# Patient Record
Sex: Male | Born: 1960
Health system: Southern US, Community
[De-identification: ages and names within clinical notes are randomized; demographics above are authoritative.]

## PROBLEM LIST (undated history)

## (undated) DIAGNOSIS — E119 Type 2 diabetes mellitus without complications: Secondary | ICD-10-CM

## (undated) DIAGNOSIS — E876 Hypokalemia: Secondary | ICD-10-CM

## (undated) DIAGNOSIS — I10 Essential (primary) hypertension: Secondary | ICD-10-CM

## (undated) HISTORY — DX: Type 2 diabetes mellitus without complications: E11.9

## (undated) HISTORY — DX: Essential (primary) hypertension: I10

## (undated) HISTORY — DX: Hypokalemia: E87.6

---

## 2006-10-10 ENCOUNTER — Encounter: Admission: RE | Admit: 2006-10-10 | Discharge: 2006-10-10 | Payer: Self-pay | Admitting: Gastroenterology

## 2008-03-05 ENCOUNTER — Encounter: Admission: RE | Admit: 2008-03-05 | Discharge: 2008-05-04 | Payer: Self-pay | Admitting: Family Medicine

## 2008-11-02 ENCOUNTER — Ambulatory Visit (HOSPITAL_COMMUNITY): Admission: RE | Admit: 2008-11-02 | Discharge: 2008-11-02 | Payer: Self-pay | Admitting: Gastroenterology

## 2008-11-02 ENCOUNTER — Encounter (INDEPENDENT_AMBULATORY_CARE_PROVIDER_SITE_OTHER): Payer: Self-pay | Admitting: Gastroenterology

## 2011-09-01 LAB — CBC
HCT: 43.4 % (ref 39.0–52.0)
RBC: 4.93 MIL/uL (ref 4.22–5.81)
WBC: 7 10*3/uL (ref 4.0–10.5)

## 2011-09-01 LAB — GLUCOSE, CAPILLARY: Glucose-Capillary: 256 mg/dL — ABNORMAL HIGH (ref 70–99)

## 2011-09-01 LAB — PROTIME-INR: INR: 1.1 (ref 0.00–1.49)

## 2016-03-16 DIAGNOSIS — M9901 Segmental and somatic dysfunction of cervical region: Secondary | ICD-10-CM | POA: Diagnosis not present

## 2016-03-16 DIAGNOSIS — M5032 Other cervical disc degeneration, mid-cervical region, unspecified level: Secondary | ICD-10-CM | POA: Diagnosis not present

## 2016-03-16 DIAGNOSIS — M531 Cervicobrachial syndrome: Secondary | ICD-10-CM | POA: Diagnosis not present

## 2016-03-16 DIAGNOSIS — M5033 Other cervical disc degeneration, cervicothoracic region: Secondary | ICD-10-CM | POA: Diagnosis not present

## 2016-04-04 DIAGNOSIS — Z111 Encounter for screening for respiratory tuberculosis: Secondary | ICD-10-CM | POA: Diagnosis not present

## 2016-04-13 DIAGNOSIS — M5032 Other cervical disc degeneration, mid-cervical region, unspecified level: Secondary | ICD-10-CM | POA: Diagnosis not present

## 2016-04-13 DIAGNOSIS — M9901 Segmental and somatic dysfunction of cervical region: Secondary | ICD-10-CM | POA: Diagnosis not present

## 2016-04-13 DIAGNOSIS — M531 Cervicobrachial syndrome: Secondary | ICD-10-CM | POA: Diagnosis not present

## 2016-04-13 DIAGNOSIS — M5033 Other cervical disc degeneration, cervicothoracic region: Secondary | ICD-10-CM | POA: Diagnosis not present

## 2016-05-10 DIAGNOSIS — M5032 Other cervical disc degeneration, mid-cervical region, unspecified level: Secondary | ICD-10-CM | POA: Diagnosis not present

## 2016-05-10 DIAGNOSIS — M5033 Other cervical disc degeneration, cervicothoracic region: Secondary | ICD-10-CM | POA: Diagnosis not present

## 2016-05-10 DIAGNOSIS — M531 Cervicobrachial syndrome: Secondary | ICD-10-CM | POA: Diagnosis not present

## 2016-05-10 DIAGNOSIS — M9901 Segmental and somatic dysfunction of cervical region: Secondary | ICD-10-CM | POA: Diagnosis not present

## 2016-06-07 DIAGNOSIS — M531 Cervicobrachial syndrome: Secondary | ICD-10-CM | POA: Diagnosis not present

## 2016-06-07 DIAGNOSIS — M5032 Other cervical disc degeneration, mid-cervical region, unspecified level: Secondary | ICD-10-CM | POA: Diagnosis not present

## 2016-06-07 DIAGNOSIS — M5033 Other cervical disc degeneration, cervicothoracic region: Secondary | ICD-10-CM | POA: Diagnosis not present

## 2016-06-07 DIAGNOSIS — M9901 Segmental and somatic dysfunction of cervical region: Secondary | ICD-10-CM | POA: Diagnosis not present

## 2016-06-19 DIAGNOSIS — L821 Other seborrheic keratosis: Secondary | ICD-10-CM | POA: Diagnosis not present

## 2016-06-19 DIAGNOSIS — D1801 Hemangioma of skin and subcutaneous tissue: Secondary | ICD-10-CM | POA: Diagnosis not present

## 2016-06-19 DIAGNOSIS — L820 Inflamed seborrheic keratosis: Secondary | ICD-10-CM | POA: Diagnosis not present

## 2016-07-12 DIAGNOSIS — M9901 Segmental and somatic dysfunction of cervical region: Secondary | ICD-10-CM | POA: Diagnosis not present

## 2016-07-12 DIAGNOSIS — M531 Cervicobrachial syndrome: Secondary | ICD-10-CM | POA: Diagnosis not present

## 2016-07-12 DIAGNOSIS — M5032 Other cervical disc degeneration, mid-cervical region, unspecified level: Secondary | ICD-10-CM | POA: Diagnosis not present

## 2016-07-12 DIAGNOSIS — M5033 Other cervical disc degeneration, cervicothoracic region: Secondary | ICD-10-CM | POA: Diagnosis not present

## 2016-08-09 DIAGNOSIS — M5032 Other cervical disc degeneration, mid-cervical region, unspecified level: Secondary | ICD-10-CM | POA: Diagnosis not present

## 2016-08-09 DIAGNOSIS — M5033 Other cervical disc degeneration, cervicothoracic region: Secondary | ICD-10-CM | POA: Diagnosis not present

## 2016-08-09 DIAGNOSIS — M9901 Segmental and somatic dysfunction of cervical region: Secondary | ICD-10-CM | POA: Diagnosis not present

## 2016-08-09 DIAGNOSIS — M531 Cervicobrachial syndrome: Secondary | ICD-10-CM | POA: Diagnosis not present

## 2016-08-28 DIAGNOSIS — Z23 Encounter for immunization: Secondary | ICD-10-CM | POA: Diagnosis not present

## 2016-09-06 DIAGNOSIS — M5032 Other cervical disc degeneration, mid-cervical region, unspecified level: Secondary | ICD-10-CM | POA: Diagnosis not present

## 2016-09-06 DIAGNOSIS — M5033 Other cervical disc degeneration, cervicothoracic region: Secondary | ICD-10-CM | POA: Diagnosis not present

## 2016-09-06 DIAGNOSIS — M531 Cervicobrachial syndrome: Secondary | ICD-10-CM | POA: Diagnosis not present

## 2016-09-06 DIAGNOSIS — M9901 Segmental and somatic dysfunction of cervical region: Secondary | ICD-10-CM | POA: Diagnosis not present

## 2016-10-11 DIAGNOSIS — M9901 Segmental and somatic dysfunction of cervical region: Secondary | ICD-10-CM | POA: Diagnosis not present

## 2016-10-11 DIAGNOSIS — M5033 Other cervical disc degeneration, cervicothoracic region: Secondary | ICD-10-CM | POA: Diagnosis not present

## 2016-10-11 DIAGNOSIS — M5032 Other cervical disc degeneration, mid-cervical region, unspecified level: Secondary | ICD-10-CM | POA: Diagnosis not present

## 2016-10-11 DIAGNOSIS — M531 Cervicobrachial syndrome: Secondary | ICD-10-CM | POA: Diagnosis not present

## 2016-11-08 DIAGNOSIS — M5032 Other cervical disc degeneration, mid-cervical region, unspecified level: Secondary | ICD-10-CM | POA: Diagnosis not present

## 2016-11-08 DIAGNOSIS — M531 Cervicobrachial syndrome: Secondary | ICD-10-CM | POA: Diagnosis not present

## 2016-11-08 DIAGNOSIS — M9901 Segmental and somatic dysfunction of cervical region: Secondary | ICD-10-CM | POA: Diagnosis not present

## 2016-11-08 DIAGNOSIS — M5033 Other cervical disc degeneration, cervicothoracic region: Secondary | ICD-10-CM | POA: Diagnosis not present

## 2016-11-27 HISTORY — PX: COLONOSCOPY: SHX174

## 2016-12-12 DIAGNOSIS — M5032 Other cervical disc degeneration, mid-cervical region, unspecified level: Secondary | ICD-10-CM | POA: Diagnosis not present

## 2016-12-12 DIAGNOSIS — M9901 Segmental and somatic dysfunction of cervical region: Secondary | ICD-10-CM | POA: Diagnosis not present

## 2016-12-12 DIAGNOSIS — M5033 Other cervical disc degeneration, cervicothoracic region: Secondary | ICD-10-CM | POA: Diagnosis not present

## 2016-12-12 DIAGNOSIS — M531 Cervicobrachial syndrome: Secondary | ICD-10-CM | POA: Diagnosis not present

## 2017-01-16 DIAGNOSIS — M5033 Other cervical disc degeneration, cervicothoracic region: Secondary | ICD-10-CM | POA: Diagnosis not present

## 2017-01-16 DIAGNOSIS — M531 Cervicobrachial syndrome: Secondary | ICD-10-CM | POA: Diagnosis not present

## 2017-01-16 DIAGNOSIS — M5032 Other cervical disc degeneration, mid-cervical region, unspecified level: Secondary | ICD-10-CM | POA: Diagnosis not present

## 2017-01-16 DIAGNOSIS — M9901 Segmental and somatic dysfunction of cervical region: Secondary | ICD-10-CM | POA: Diagnosis not present

## 2017-02-06 DIAGNOSIS — M5033 Other cervical disc degeneration, cervicothoracic region: Secondary | ICD-10-CM | POA: Diagnosis not present

## 2017-02-06 DIAGNOSIS — M531 Cervicobrachial syndrome: Secondary | ICD-10-CM | POA: Diagnosis not present

## 2017-02-06 DIAGNOSIS — M5032 Other cervical disc degeneration, mid-cervical region, unspecified level: Secondary | ICD-10-CM | POA: Diagnosis not present

## 2017-02-06 DIAGNOSIS — M9901 Segmental and somatic dysfunction of cervical region: Secondary | ICD-10-CM | POA: Diagnosis not present

## 2017-02-15 DIAGNOSIS — Z125 Encounter for screening for malignant neoplasm of prostate: Secondary | ICD-10-CM | POA: Diagnosis not present

## 2017-02-15 DIAGNOSIS — I10 Essential (primary) hypertension: Secondary | ICD-10-CM | POA: Diagnosis not present

## 2017-02-15 DIAGNOSIS — R809 Proteinuria, unspecified: Secondary | ICD-10-CM | POA: Diagnosis not present

## 2017-02-15 DIAGNOSIS — K760 Fatty (change of) liver, not elsewhere classified: Secondary | ICD-10-CM | POA: Diagnosis not present

## 2017-02-15 DIAGNOSIS — E1329 Other specified diabetes mellitus with other diabetic kidney complication: Secondary | ICD-10-CM | POA: Diagnosis not present

## 2017-02-15 DIAGNOSIS — E782 Mixed hyperlipidemia: Secondary | ICD-10-CM | POA: Diagnosis not present

## 2017-02-19 DIAGNOSIS — R809 Proteinuria, unspecified: Secondary | ICD-10-CM | POA: Diagnosis not present

## 2017-02-23 DIAGNOSIS — E042 Nontoxic multinodular goiter: Secondary | ICD-10-CM | POA: Diagnosis not present

## 2017-03-06 DIAGNOSIS — M9901 Segmental and somatic dysfunction of cervical region: Secondary | ICD-10-CM | POA: Diagnosis not present

## 2017-03-06 DIAGNOSIS — M5033 Other cervical disc degeneration, cervicothoracic region: Secondary | ICD-10-CM | POA: Diagnosis not present

## 2017-03-06 DIAGNOSIS — M531 Cervicobrachial syndrome: Secondary | ICD-10-CM | POA: Diagnosis not present

## 2017-03-06 DIAGNOSIS — M5032 Other cervical disc degeneration, mid-cervical region, unspecified level: Secondary | ICD-10-CM | POA: Diagnosis not present

## 2017-03-26 ENCOUNTER — Encounter: Payer: Self-pay | Admitting: Endocrinology

## 2017-03-26 ENCOUNTER — Ambulatory Visit (INDEPENDENT_AMBULATORY_CARE_PROVIDER_SITE_OTHER): Payer: BLUE CROSS/BLUE SHIELD | Admitting: Endocrinology

## 2017-03-26 DIAGNOSIS — I10 Essential (primary) hypertension: Secondary | ICD-10-CM

## 2017-03-26 DIAGNOSIS — E042 Nontoxic multinodular goiter: Secondary | ICD-10-CM | POA: Diagnosis not present

## 2017-03-26 DIAGNOSIS — E876 Hypokalemia: Secondary | ICD-10-CM | POA: Diagnosis not present

## 2017-03-26 DIAGNOSIS — E119 Type 2 diabetes mellitus without complications: Secondary | ICD-10-CM

## 2017-03-26 NOTE — Progress Notes (Signed)
   Subjective:    Patient ID: Francis Silva, male    DOB: [birth year], 56 y.o.   MRN: [id number]  HPI Pt is referred by Dr Rex Kras, for nodular thyroid.  In 2015, pt was incidentally noted to have a slightly nodular thyroid, on carotid US.  He is unaware of ever having had thyroid problems in the past.  He has no h/o XRT or surgery to the neck.  He denies neck pain, or assoc swelling. Past Medical History:  Diagnosis Date  . Diabetes ([place])   . HTN (hypertension)   . Hypokalemia     No past surgical history on file.  Social History   Social History  . Marital status: Married    Spouse name: N/A  . Number of children: N/A  . Years of education: N/A   Occupational History  . Not on file.   Social History Main Topics  . Smoking status: Never Smoker  . Smokeless tobacco: Never Used  . Alcohol use Not on file  . Drug use: Unknown  . Sexual activity: Not on file   Other Topics Concern  . Not on file   Social History Narrative  . No narrative on file    No current outpatient prescriptions on file prior to visit.   No current facility-administered medications on file prior to visit.     Not on File  Family History  Problem Relation Age of Onset  . Thyroid disease Sister     BP (!) 146/82   Pulse 98   Ht 5\' 9"  (1.753 m)   Wt 210 lb (95.3 kg)   SpO2 96%   BMI 31.01 kg/m     Review of Systems Denies weight change, hoarseness, diplopia, chest pain, sob, cough, dysphagia, diarrhea, itching, flushing, easy bruising, depression, cold intolerance, headache, numbness, and rhinorrhea.      Objective:   Physical Exam VS: see vs page GEN: no distress HEAD: head: no deformity eyes: no periorbital swelling, no proptosis external nose and ears are normal mouth: no lesion seen NECK: thyroid is not enlarged.  The surface is slightly irreg, but I cannot palpate any discrete nodule.   CHEST WALL: no deformity LUNGS: clear to auscultation CV: reg rate and rhythm, no  murmur ABD: abdomen is soft, nontender.  no hepatosplenomegaly.  not distended.  no hernia MUSCULOSKELETAL: muscle bulk and strength are grossly normal.  no obvious joint swelling.  gait is normal and steady.  EXTEMITIES: no deformity.  no edema PULSES: no carotid bruit NEURO:  cn 2-12 grossly intact.   readily moves all 4's.  sensation is intact to touch on all 4's.  No tremor. SKIN:  Normal texture and temperature.  No rash or suspicious lesion is visible.  Not diaphoretic.  NODES:  None palpable at the neck PSYCH: alert, well-oriented.  Does not appear anxious nor depressed.    outside test results are reviewed:  TSH=1  Thyroid US; multinodular goiter, with no nodule meeting criteria for bx.  I have reviewed outside records, and summarized: Pt was noted to have goiter, and referred here. Other med probs were stable, and pt was feeling well.     Assessment & Plan:  Multinodular goiter, new.    Patient Instructions  No thyroid treatment or further testing is needed now. Please return in 1 year. most of the time, a "lumpy thyroid" will eventually become overactive.  this is usually a slow process, happening over the span of many years.

## 2017-03-26 NOTE — Patient Instructions (Signed)
No thyroid treatment or further testing is needed now. Please return in 1 year. most of the time, a "lumpy thyroid" will eventually become overactive.  this is usually a slow process, happening over the span of many years.

## 2017-03-27 ENCOUNTER — Encounter: Payer: Self-pay | Admitting: Endocrinology

## 2017-03-27 DIAGNOSIS — I10 Essential (primary) hypertension: Secondary | ICD-10-CM | POA: Insufficient documentation

## 2017-03-27 DIAGNOSIS — E042 Nontoxic multinodular goiter: Secondary | ICD-10-CM | POA: Insufficient documentation

## 2017-03-27 DIAGNOSIS — E876 Hypokalemia: Secondary | ICD-10-CM | POA: Insufficient documentation

## 2017-03-27 DIAGNOSIS — E119 Type 2 diabetes mellitus without complications: Secondary | ICD-10-CM | POA: Insufficient documentation

## 2017-04-03 DIAGNOSIS — M5032 Other cervical disc degeneration, mid-cervical region, unspecified level: Secondary | ICD-10-CM | POA: Diagnosis not present

## 2017-04-03 DIAGNOSIS — M5033 Other cervical disc degeneration, cervicothoracic region: Secondary | ICD-10-CM | POA: Diagnosis not present

## 2017-04-03 DIAGNOSIS — M531 Cervicobrachial syndrome: Secondary | ICD-10-CM | POA: Diagnosis not present

## 2017-04-03 DIAGNOSIS — M9901 Segmental and somatic dysfunction of cervical region: Secondary | ICD-10-CM | POA: Diagnosis not present

## 2017-04-12 DIAGNOSIS — Z1211 Encounter for screening for malignant neoplasm of colon: Secondary | ICD-10-CM | POA: Diagnosis not present

## 2017-04-19 DIAGNOSIS — E1129 Type 2 diabetes mellitus with other diabetic kidney complication: Secondary | ICD-10-CM | POA: Diagnosis not present

## 2017-04-19 DIAGNOSIS — Z6831 Body mass index (BMI) 31.0-31.9, adult: Secondary | ICD-10-CM | POA: Diagnosis not present

## 2017-04-19 DIAGNOSIS — R809 Proteinuria, unspecified: Secondary | ICD-10-CM | POA: Diagnosis not present

## 2017-04-19 DIAGNOSIS — I10 Essential (primary) hypertension: Secondary | ICD-10-CM | POA: Diagnosis not present

## 2017-04-20 ENCOUNTER — Other Ambulatory Visit: Payer: Self-pay | Admitting: Nephrology

## 2017-04-20 DIAGNOSIS — R809 Proteinuria, unspecified: Secondary | ICD-10-CM

## 2017-04-20 DIAGNOSIS — E119 Type 2 diabetes mellitus without complications: Secondary | ICD-10-CM | POA: Diagnosis not present

## 2017-04-30 ENCOUNTER — Ambulatory Visit
Admission: RE | Admit: 2017-04-30 | Discharge: 2017-04-30 | Disposition: A | Discharge status: Home / Self Care | Payer: BLUE CROSS/BLUE SHIELD | Source: Ambulatory Visit | Attending: Nephrology | Admitting: Nephrology

## 2017-04-30 DIAGNOSIS — R809 Proteinuria, unspecified: Secondary | ICD-10-CM

## 2017-05-01 DIAGNOSIS — M5033 Other cervical disc degeneration, cervicothoracic region: Secondary | ICD-10-CM | POA: Diagnosis not present

## 2017-05-01 DIAGNOSIS — M531 Cervicobrachial syndrome: Secondary | ICD-10-CM | POA: Diagnosis not present

## 2017-05-01 DIAGNOSIS — M5032 Other cervical disc degeneration, mid-cervical region, unspecified level: Secondary | ICD-10-CM | POA: Diagnosis not present

## 2017-05-01 DIAGNOSIS — M9901 Segmental and somatic dysfunction of cervical region: Secondary | ICD-10-CM | POA: Diagnosis not present

## 2017-05-29 DIAGNOSIS — I10 Essential (primary) hypertension: Secondary | ICD-10-CM | POA: Diagnosis not present

## 2017-05-29 DIAGNOSIS — D472 Monoclonal gammopathy: Secondary | ICD-10-CM | POA: Diagnosis not present

## 2017-05-29 DIAGNOSIS — R809 Proteinuria, unspecified: Secondary | ICD-10-CM | POA: Diagnosis not present

## 2017-05-29 DIAGNOSIS — E1129 Type 2 diabetes mellitus with other diabetic kidney complication: Secondary | ICD-10-CM | POA: Diagnosis not present

## 2017-05-31 DIAGNOSIS — M5033 Other cervical disc degeneration, cervicothoracic region: Secondary | ICD-10-CM | POA: Diagnosis not present

## 2017-05-31 DIAGNOSIS — M5032 Other cervical disc degeneration, mid-cervical region, unspecified level: Secondary | ICD-10-CM | POA: Diagnosis not present

## 2017-05-31 DIAGNOSIS — M9901 Segmental and somatic dysfunction of cervical region: Secondary | ICD-10-CM | POA: Diagnosis not present

## 2017-05-31 DIAGNOSIS — M531 Cervicobrachial syndrome: Secondary | ICD-10-CM | POA: Diagnosis not present

## 2017-06-08 ENCOUNTER — Other Ambulatory Visit: Payer: Self-pay | Admitting: Nephrology

## 2017-06-08 DIAGNOSIS — D472 Monoclonal gammopathy: Secondary | ICD-10-CM

## 2017-06-22 ENCOUNTER — Ambulatory Visit
Admission: RE | Admit: 2017-06-22 | Discharge: 2017-06-22 | Disposition: A | Discharge status: Home / Self Care | Payer: BLUE CROSS/BLUE SHIELD | Source: Ambulatory Visit | Attending: Nephrology | Admitting: Nephrology

## 2017-06-22 DIAGNOSIS — D472 Monoclonal gammopathy: Secondary | ICD-10-CM

## 2017-06-22 DIAGNOSIS — I6523 Occlusion and stenosis of bilateral carotid arteries: Secondary | ICD-10-CM | POA: Diagnosis not present

## 2017-06-28 DIAGNOSIS — M531 Cervicobrachial syndrome: Secondary | ICD-10-CM | POA: Diagnosis not present

## 2017-06-28 DIAGNOSIS — M5033 Other cervical disc degeneration, cervicothoracic region: Secondary | ICD-10-CM | POA: Diagnosis not present

## 2017-06-28 DIAGNOSIS — M9901 Segmental and somatic dysfunction of cervical region: Secondary | ICD-10-CM | POA: Diagnosis not present

## 2017-06-28 DIAGNOSIS — M5032 Other cervical disc degeneration, mid-cervical region, unspecified level: Secondary | ICD-10-CM | POA: Diagnosis not present

## 2017-07-25 ENCOUNTER — Encounter: Payer: Self-pay | Admitting: Hematology

## 2017-07-26 DIAGNOSIS — M9901 Segmental and somatic dysfunction of cervical region: Secondary | ICD-10-CM | POA: Diagnosis not present

## 2017-07-26 DIAGNOSIS — M531 Cervicobrachial syndrome: Secondary | ICD-10-CM | POA: Diagnosis not present

## 2017-07-26 DIAGNOSIS — M5032 Other cervical disc degeneration, mid-cervical region, unspecified level: Secondary | ICD-10-CM | POA: Diagnosis not present

## 2017-07-26 DIAGNOSIS — M5033 Other cervical disc degeneration, cervicothoracic region: Secondary | ICD-10-CM | POA: Diagnosis not present

## 2017-07-31 DIAGNOSIS — D472 Monoclonal gammopathy: Secondary | ICD-10-CM | POA: Diagnosis not present

## 2017-07-31 DIAGNOSIS — E1129 Type 2 diabetes mellitus with other diabetic kidney complication: Secondary | ICD-10-CM | POA: Diagnosis not present

## 2017-07-31 DIAGNOSIS — R809 Proteinuria, unspecified: Secondary | ICD-10-CM | POA: Diagnosis not present

## 2017-07-31 DIAGNOSIS — I10 Essential (primary) hypertension: Secondary | ICD-10-CM | POA: Diagnosis not present

## 2017-08-05 NOTE — Progress Notes (Signed)
[place]  Telephone:([phone]) [phone] Fax:([phone]) [place] CONSULT NOTE 08/06/17  Patient Care Team: Hulan Fess, MD as PCP - General (Family Medicine)  CHIEF COMPLAINTS/PURPOSE OF CONSULTATION:  MGUS   HISTORY OF PRESENTING ILLNESS:   Francis Silva 56 y.o. male with a history of hypertension and diabetes who was noted to have proteinuria and was referred to nephrology for further evaluation.  Patient was seen by Dr. Ila Mcgill at [facility] for further evaluation of his non-nephrotic range proteinuria that was initially thought to be due to the patient hx of DM or HTN.  Urine protein creatinine ratio was 407 mg/g of creatinine As a part of the workup for proteinuria the patient had an SPEP which showed a M spike of 0.4 g/dL. Immunofixation showed IgG monoclonal protein with A light chain specificity. IgG levels were normal at 1172. Normal IgA and IgM levels noted. Increase in serum free kappa light chains at 81.8 with a serum lambda free light chains at 10 with an abnormal ratio of 8.02.  Pt had a DG Bone Survey Met completed on 06/22/2017 with results revealing: Lucencies noted scattered throughout the skull. Myeloma and/or metastatic disease could present this fashion. No other significant bony lucencies are identified. 2. Calcifications are noted over the right neck in the region of the right submandibular gland. Submandibular gland tumor could present in this fashion.    Pt was referrred to our clinic clinic by Dr. Windy Kalata for further evaluation of the patient newly diagnosed monoclonal paraproteinemia.  Outside labs not show any anemia, elevated creatinine or hypercalcemia. He does not have any focal bone pains in his spine and ribs or skull.   He notes that he is a type II DM that has been uncontrolled recently and was referred to a nephrologist by his PCP due to a kidney workup. Pt has been  diagnosed with Type II DM since 1999. He reports that he presented with proteinuria and voices concern for what the next steps would be. He reports that he is doing well overall and hasn't had any acute symptoms. He notes that he has been at his baseline for the past 6 months. He reports pinched nerve to his neck with residual numbness to his hands x 2 years. Pt is being evaluated by a chiropractor for this and his symptoms have mildly alleviated.   He denies history of abnormal bone pain or bone fracture. Patient denies history of recurrent infection or atypical infections such as shingles of meningitis.   Denies fatigue, SOB, CP. He reports occasional HA (none that are different from his typical headaches). Denies pain. Denies hematuria. Denies skin rash.    MEDICAL HISTORY:  Past Medical History:  Diagnosis Date  . Diabetes ([place])   . HTN (hypertension)   . Hypokalemia     SURGICAL HISTORY: No past surgical history on file.  SOCIAL HISTORY: Social History   Social History  . Marital status: Married    Spouse name: N/A  . Number of children: N/A  . Years of education: N/A   Occupational History  . Not on file.   Social History Main Topics  . Smoking status: Never Smoker  . Smokeless tobacco: Never Used  . Alcohol use Not on file  . Drug use: Unknown  . Sexual activity: Not on file   Other Topics Concern  . Not on file   Social History Narrative  . No narrative  on file    FAMILY HISTORY: Family History  Problem Relation Age of Onset  . Thyroid disease Sister     ALLERGIES:  has no allergies on file.  MEDICATIONS:  Current Outpatient Prescriptions  Medication Sig Dispense Refill  . amLODipine (NORVASC) 10 MG tablet TK 1 T PO QD  1  . Ascorbic Acid (VITAMIN C) 1000 MG tablet Take 1,000 mg by mouth daily.    Marland Kitchen aspirin EC 81 MG tablet Take 81 mg by mouth daily.    Marland Kitchen BAYER CONTOUR NEXT TEST test strip CHECK BLOOD SUGAR ONCE DAILY  0  .  benazepril-hydrochlorthiazide (LOTENSIN HCT) 20-12.5 MG tablet TK 2 TS PO QD  3  . glimepiride (AMARYL) 4 MG tablet TK 1 T PO BID WC  1  . JANUVIA 100 MG tablet TK 1 T PO QD  3  . metFORMIN (GLUCOPHAGE-XR) 500 MG 24 hr tablet TK 4 TS PO QAM  5  . metoprolol succinate (TOPROL-XL) 50 MG 24 hr tablet TK 2 TS PO QAM AND 2 T QPM  3  . potassium chloride (MICRO-K) 10 MEQ CR capsule TK 1 C PO QD WF  3  . WELCHOL 625 MG tablet TK 3 TS PO BID WC  3   No current facility-administered medications for this visit.     REVIEW OF SYSTEMS:   Eyes: Denies blurriness of vision, double vision or watery eyes Ears, nose, mouth, throat, and face: Denies mucositis or sore throat Respiratory: Denies cough, dyspnea or wheezes Cardiovascular: Denies palpitation, chest discomfort or lower extremity swelling Gastrointestinal:  Denies nausea, heartburn or change in bowel habits Skin: Denies abnormal skin rashes Lymphatics: Denies new lymphadenopathy or easy bruising Neurological: (+) chronic numbness to bilateral hands due to pinched cervical nerve. Denies tingling or new weaknesses Behavioral/Psych: Mood is stable, no new changes  All other systems were reviewed with the patient and are negative.  PHYSICAL EXAMINATION:  ECOG PERFORMANCE STATUS: 0 - Asymptomatic  Vitals:   08/06/17 1017  BP: (!) 165/83  Pulse: 71  Resp: 20  Temp: 98.4 F (36.9 C)  SpO2: 97%   Filed Weights   08/06/17 1017  Weight: 214 lb 6.4 oz (97.3 kg)    GENERAL:alert, no distress and comfortable SKIN: skin color, texture, turgor are normal, no rashes or significant lesions EYES: normal, conjunctiva are pink and non-injected, sclera clear OROPHARYNX:no exudate, no erythema and lips, buccal mucosa, and tongue normal  NECK: supple, thyroid normal size, non-tender, without nodularity LYMPH:  no palpable lymphadenopathy in the cervical, axillary or inguinal LUNGS: clear to auscultation and percussion with normal breathing  effort HEART: regular rate & rhythm and no murmurs and no lower extremity edema ABDOMEN:abdomen soft, non-tender and normal bowel sounds Musculoskeletal:no cyanosis of digits and no clubbing  PSYCH: alert & oriented x 3 with fluent speech NEURO: no focal motor/sensory deficits  LABORATORY DATA:  I have reviewed the data as listed  . CBC Latest Ref Rng & Units 08/06/2017 11/02/2008  WBC 4.0 - 10.3 10e3/uL 7.9 7.0  Hemoglobin 13.0 - 17.1 g/dL 15.0 15.1  Hematocrit 38.4 - 49.9 % 42.8 43.4  Platelets 140 - 400 10e3/uL 180 195   . CMP Latest Ref Rng & Units 08/06/2017  Glucose 70 - 140 mg/dl 262(H)  BUN 7.0 - 26.0 mg/dL 13.1  Creatinine 0.7 - 1.3 mg/dL 1.1  Sodium 136 - 145 mEq/L 138  Potassium 3.5 - 5.1 mEq/L 3.6  CO2 22 - 29 mEq/L 30(H)  Calcium 8.4 -  10.4 mg/dL 10.1  Total Protein 6.4 - 8.3 g/dL 8.0  Total Bilirubin 0.20 - 1.20 mg/dL 1.16  Alkaline Phos 40 - 150 U/L 63  AST 5 - 34 U/L 48(H)  ALT 0 - 55 U/L 99(H)   Component     Latest Ref Rng & Units 08/06/2017  Sed Rate     0 - 30 mm/hr 5  LDH     125 - 245 U/L 210  HIV Screen 4th Generation wRfx     Non Reactive Non Reactive  Hep C Virus Ab     0.0 - 0.9 s/co ratio <0.1     RADIOGRAPHIC STUDIES: I have personally reviewed the radiological images as listed and agreed with the findings in the report. No results found.    DG Bone Survey Met, 06/22/2017 IMPRESSION: 1. Lucencies noted scattered throughout the skull. Myeloma and/or metastatic disease could present this fashion. No other significant bony lucencies are identified. 2. Calcifications are noted over the right neck in the region of the right submandibular gland. Submandibular gland tumor could present in this fashion. Nonenhanced and enhanced maxillofacial CT is suggested for further evaluation. 3. Bilateral carotid atherosclerotic vascular disease.  ASSESSMENT & PLAN:  56 y.o. male with   #1 IgG kappa monoclonal paraproteinemia Patient has no anemia and  no overt renal failure and no hypercalcemia at this time. He has multiple indeterminate on a lucencies in the skull which will need to be worked up to rule out concerns with myelomatous lesions. His M spike was only 0.4 g/dL however the serum kappa lambda ratio is normal at 8. Cannot rule out a plasma cell dyscrasia.  #2  Non nephrotic Range proteinuria -clinically this appears to likely be related to diabetic nephropathy with additional comorbidities of hypertension. Unlikely to be related to plasma cell dyscrasia though with abnormalities in kappa/ lambda ratio cannot rule out possible of AL Amyloidosis. The decision for further working up his proteinuria with a kidney biopsy would be a determination that could be deferred to the nephrologist based on clinical suspicion given patient has other obvious explanations. Plan -Discussed in detail regarding the patient recent labwork and the significance and possible implications of finding a monoclonal paraproteinemia. -We discussed that this likely represents MGUS but other plasma cell dyscrasias would need to be considered and ruled out. -To rule out multiple myeloma, I recommend complete blood work including an interval myeloma panel, serum free light chains, 24 hour urine collection for UPEP  -Will order PET scan within 5-7 days to evaluate and r/o multiple myeloma in the setting of indeterminate bone lesion on skeletal survey.  -Patient stated that he would like to continue with labs and PET scan at this time. Patient reported that he would like to follow up in the office following labs and PET scan to discuss the need for bone marrow biopsy. -Patient to follow up in office in 2 weeks following PET scan and labs. Depending on test results (PET Scan and labs), we may or may not proceed with bone marrow aspirate and biopsy to be completed by IR  Orders Placed This Encounter  Procedures  . NM PET Image Initial (PI) Whole Body    Standing Status:    Future    Standing Expiration Date:   08/06/2018    Order Specific Question:   If indicated for the ordered procedure, I authorize the administration of a radiopharmaceutical per Radiology protocol    Answer:   Yes    Order Specific  Question:   Preferred imaging location?    Answer:   [facility]    Order Specific Question:   Radiology Contrast Protocol - do NOT remove file path    Answer:   [email or web address]    Order Specific Question:   Reason for Exam additional comments    Answer:   R/o Myeloma--patient with monoclonal paraproteinemia and multiple indetermine skull lesions  . CBC & Diff and Retic    Standing Status:   Future    Number of Occurrences:   1    Standing Expiration Date:   08/06/2018  . Comprehensive metabolic panel    Standing Status:   Future    Number of Occurrences:   1    Standing Expiration Date:   08/06/2018  . Multiple Myeloma Panel (SPEP&IFE w/QIG)    Standing Status:   Future    Number of Occurrences:   1    Standing Expiration Date:   08/06/2018  . Kappa/lambda light chains    Standing Status:   Future    Number of Occurrences:   1    Standing Expiration Date:   09/10/2018  . Sedimentation rate    Standing Status:   Future    Number of Occurrences:   1    Standing Expiration Date:   08/06/2018  . Lactate dehydrogenase    Standing Status:   Future    Number of Occurrences:   1    Standing Expiration Date:   08/06/2018  . HIV antibody    Standing Status:   Future    Number of Occurrences:   1    Standing Expiration Date:   08/06/2018  . Hepatitis C antibody    Standing Status:   Future    Number of Occurrences:   1    Standing Expiration Date:   08/06/2018  . 24-Hr Ur UPEP/UIFE/Light Chains/TP    Standing Status:   Future    Standing Expiration Date:   08/07/2018   Plan:  RTC in 2 weeks with Dr. Irene Limbo following PET Scan and labs for discussion of next steps.   All questions were answered. The patient knows to call the  clinic with any problems, questions or concerns. I spent 40 minutes counseling the patient face to face. The total time spent in the appointment was 60 minutes and more than 50% was on counseling.    Sullivan Lone MD [place] AAHIVMS [facility] Hematology/Oncology Physician [facility]  (Office):       [phone] (Work cell):  [phone] (Fax):           [phone]   Sullivan Lone, MD   This document serves as a record of services personally performed by Sullivan Lone, MD. It was created on her behalf by Steva Colder, a trained medical scribe. The creation of this record is based on the scribe's personal observations and the provider's statements to them. This document has been checked and approved by the attending provider.

## 2017-08-06 ENCOUNTER — Encounter: Payer: Self-pay | Admitting: Hematology

## 2017-08-06 ENCOUNTER — Ambulatory Visit (HOSPITAL_BASED_OUTPATIENT_CLINIC_OR_DEPARTMENT_OTHER): Payer: BLUE CROSS/BLUE SHIELD | Admitting: Hematology

## 2017-08-06 ENCOUNTER — Ambulatory Visit (HOSPITAL_BASED_OUTPATIENT_CLINIC_OR_DEPARTMENT_OTHER): Payer: BLUE CROSS/BLUE SHIELD

## 2017-08-06 VITALS — BP 165/83 | HR 71 | Temp 98.4°F | Resp 20 | Ht 69.0 in | Wt 214.4 lb

## 2017-08-06 DIAGNOSIS — M899 Disorder of bone, unspecified: Secondary | ICD-10-CM

## 2017-08-06 DIAGNOSIS — E119 Type 2 diabetes mellitus without complications: Secondary | ICD-10-CM | POA: Diagnosis not present

## 2017-08-06 DIAGNOSIS — D472 Monoclonal gammopathy: Secondary | ICD-10-CM | POA: Diagnosis not present

## 2017-08-06 DIAGNOSIS — R809 Proteinuria, unspecified: Secondary | ICD-10-CM | POA: Diagnosis not present

## 2017-08-06 DIAGNOSIS — I10 Essential (primary) hypertension: Secondary | ICD-10-CM

## 2017-08-06 LAB — CBC & DIFF AND RETIC
BASO%: 0.4 % (ref 0.0–2.0)
Basophils Absolute: 0 10*3/uL (ref 0.0–0.1)
EOS%: 2.9 % (ref 0.0–7.0)
Eosinophils Absolute: 0.2 10*3/uL (ref 0.0–0.5)
HCT: 42.8 % (ref 38.4–49.9)
HGB: 15 g/dL (ref 13.0–17.1)
Immature Retic Fract: 5.1 % (ref 3.00–10.60)
LYMPH%: 17.7 % (ref 14.0–49.0)
MCH: 30.2 pg (ref 27.2–33.4)
MCHC: 35 g/dL (ref 32.0–36.0)
MCV: 86.1 fL (ref 79.3–98.0)
MONO#: 0.9 10*3/uL (ref 0.1–0.9)
MONO%: 10.7 % (ref 0.0–14.0)
NEUT%: 68.3 % (ref 39.0–75.0)
NEUTROS ABS: 5.4 10*3/uL (ref 1.5–6.5)
PLATELETS: 180 10*3/uL (ref 140–400)
RBC: 4.97 10*6/uL (ref 4.20–5.82)
RDW: 12.2 % (ref 11.0–14.6)
Retic %: 1.27 % (ref 0.80–1.80)
Retic Ct Abs: 63.12 10*3/uL (ref 34.80–93.90)
WBC: 7.9 10*3/uL (ref 4.0–10.3)
lymph#: 1.4 10*3/uL (ref 0.9–3.3)

## 2017-08-06 LAB — COMPREHENSIVE METABOLIC PANEL
ALT: 99 U/L — ABNORMAL HIGH (ref 0–55)
ANION GAP: 9 meq/L (ref 3–11)
AST: 48 U/L — ABNORMAL HIGH (ref 5–34)
Albumin: 4.3 g/dL (ref 3.5–5.0)
Alkaline Phosphatase: 63 U/L (ref 40–150)
BILIRUBIN TOTAL: 1.16 mg/dL (ref 0.20–1.20)
BUN: 13.1 mg/dL (ref 7.0–26.0)
CO2: 30 meq/L — AB (ref 22–29)
Calcium: 10.1 mg/dL (ref 8.4–10.4)
Chloride: 98 mEq/L (ref 98–109)
Creatinine: 1.1 mg/dL (ref 0.7–1.3)
EGFR: 73 mL/min/{1.73_m2} — AB (ref >90)
GLUCOSE: 262 mg/dL — AB (ref 70–140)
POTASSIUM: 3.6 meq/L (ref 3.5–5.1)
SODIUM: 138 meq/L (ref 136–145)
Total Protein: 8 g/dL (ref 6.4–8.3)

## 2017-08-06 LAB — LACTATE DEHYDROGENASE: LDH: 210 U/L (ref 125–245)

## 2017-08-07 LAB — HIV ANTIBODY (ROUTINE TESTING W REFLEX): HIV SCREEN 4TH GENERATION: NONREACTIVE

## 2017-08-07 LAB — KAPPA/LAMBDA LIGHT CHAINS
IG KAPPA FREE LIGHT CHAIN: 92.5 mg/L — AB (ref 3.3–19.4)
Ig Lambda Free Light Chain: 11.4 mg/L (ref 5.7–26.3)
Kappa/Lambda FluidC Ratio: 8.11 — ABNORMAL HIGH (ref 0.26–1.65)

## 2017-08-07 LAB — HEPATITIS C ANTIBODY

## 2017-08-07 LAB — SEDIMENTATION RATE: Sedimentation Rate-Westergren: 5 mm/hr (ref 0–30)

## 2017-08-09 LAB — MULTIPLE MYELOMA PANEL, SERUM
ALBUMIN SERPL ELPH-MCNC: 4.2 g/dL (ref 2.9–4.4)
ALBUMIN/GLOB SERPL: 1.4 (ref 0.7–1.7)
ALPHA 1: 0.2 g/dL (ref 0.0–0.4)
ALPHA2 GLOB SERPL ELPH-MCNC: 0.9 g/dL (ref 0.4–1.0)
B-GLOBULIN SERPL ELPH-MCNC: 1.1 g/dL (ref 0.7–1.3)
GAMMA GLOB SERPL ELPH-MCNC: 0.9 g/dL (ref 0.4–1.8)
GLOBULIN, TOTAL: 3.2 g/dL (ref 2.2–3.9)
IgA, Qn, Serum: 122 mg/dL (ref 90–386)
IgM, Qn, Serum: 36 mg/dL (ref 20–172)
M PROTEIN SERPL ELPH-MCNC: 0.4 g/dL — AB
Total Protein: 7.4 g/dL (ref 6.0–8.5)

## 2017-08-16 ENCOUNTER — Telehealth: Payer: Self-pay

## 2017-08-16 ENCOUNTER — Ambulatory Visit (HOSPITAL_COMMUNITY): Payer: BLUE CROSS/BLUE SHIELD

## 2017-08-16 ENCOUNTER — Other Ambulatory Visit: Payer: Self-pay | Admitting: Hematology

## 2017-08-16 DIAGNOSIS — R930 Abnormal findings on diagnostic imaging of skull and head, not elsewhere classified: Secondary | ICD-10-CM

## 2017-08-16 NOTE — Telephone Encounter (Signed)
Spoke with pt regarding PET scan. Imaging appt cancelled for tomorrow and f/u with [facility] Monday cancelled as well. Order to be placed by Dr. Irene Limbo for bone scan instead and f/u appt rescheduled post imaging completion for review with pt. Confirmed number for Central Sched[employer] to call Monday afternoon if he has not heard anything from them by that time [phone]). Pt verbalized understanding.

## 2017-08-16 NOTE — Telephone Encounter (Signed)
Called Francis Silva back to discuss hold-up on the PET scan scheduled for tomorrow. Told him that Dr. Irene Limbo is required to complete a peer-to-peer prior to imaging. If okay, we will proceed with appointment tomorrow.

## 2017-08-17 ENCOUNTER — Ambulatory Visit (HOSPITAL_COMMUNITY): Payer: BLUE CROSS/BLUE SHIELD

## 2017-08-20 ENCOUNTER — Ambulatory Visit: Payer: BLUE CROSS/BLUE SHIELD | Admitting: Hematology

## 2017-08-22 ENCOUNTER — Telehealth: Payer: Self-pay

## 2017-08-22 NOTE — Telephone Encounter (Signed)
Pt called this AM confused about being scheduled for bone marrow biopsy and was sent a letter from insurance telling him that PET scan was not approved because the doctor said he had myeloma. Explained to the pt that he has not been diagnosed and that is why we are doing the bone scan, not yet determined need for bone marrow biopsy. Pt verbalized understanding and agreement that is what he originally thought the reason for the imaging was. Pt scheduled for f/u on 10/10 at 3pm with Dr. Irene Limbo to review Bone Scan. Pt will call the night before or morning of if he has to go in for jury duty in [facility].

## 2017-08-23 DIAGNOSIS — M9901 Segmental and somatic dysfunction of cervical region: Secondary | ICD-10-CM | POA: Diagnosis not present

## 2017-08-23 DIAGNOSIS — M531 Cervicobrachial syndrome: Secondary | ICD-10-CM | POA: Diagnosis not present

## 2017-08-23 DIAGNOSIS — M5033 Other cervical disc degeneration, cervicothoracic region: Secondary | ICD-10-CM | POA: Diagnosis not present

## 2017-08-23 DIAGNOSIS — M5032 Other cervical disc degeneration, mid-cervical region, unspecified level: Secondary | ICD-10-CM | POA: Diagnosis not present

## 2017-08-28 ENCOUNTER — Encounter (HOSPITAL_COMMUNITY)
Admission: RE | Admit: 2017-08-28 | Discharge: 2017-08-28 | Disposition: A | Discharge status: Home / Self Care | Payer: BLUE CROSS/BLUE SHIELD | Source: Ambulatory Visit | Attending: Hematology | Admitting: Hematology

## 2017-08-28 DIAGNOSIS — R930 Abnormal findings on diagnostic imaging of skull and head, not elsewhere classified: Secondary | ICD-10-CM | POA: Diagnosis not present

## 2017-08-28 DIAGNOSIS — M859 Disorder of bone density and structure, unspecified: Secondary | ICD-10-CM | POA: Diagnosis not present

## 2017-08-28 MED ORDER — TECHNETIUM TC 99M MEDRONATE IV KIT
25.0000 | PACK | Freq: Once | INTRAVENOUS | Status: AC | PRN
  Administered 2017-08-28: 20 via INTRAVENOUS

## 2017-09-04 NOTE — Progress Notes (Signed)
Francis Silva  [phone] Fax:([phone]) [place] HEMATOLOGY ONCOLOGY PROGRESS NOTE  DOS .09/05/2017  Patient Care Team: Hulan Fess, MD as PCP - General (Family Medicine)  CHIEF COMPLAINTS/PURPOSE OF CONSULTATION:  MGUS   HISTORY OF PRESENTING ILLNESS:   Francis Silva 56 y.o. male with a history of hypertension and diabetes who was noted to have proteinuria and was referred to nephrology for further evaluation.  Patient was seen by Dr. Ila Mcgill at [facility] for further evaluation of his non-nephrotic range proteinuria that was initially thought to be due to the patient hx of DM or HTN.  Urine protein creatinine ratio was 407 mg/g of creatinine As a part of the workup for proteinuria the patient had an SPEP which showed a M spike of 0.4 g/dL. Immunofixation showed IgG monoclonal protein with A light chain specificity. IgG levels were normal at 1172. Normal IgA and IgM levels noted. Increase in serum free kappa light chains at 81.8 with a serum lambda free light chains at 10 with an abnormal ratio of 8.02.  Pt had a DG Bone Survey Met completed on 06/22/2017 with results revealing: Lucencies noted scattered throughout the skull. Myeloma and/or metastatic disease could present this fashion. No other significant bony lucencies are identified. 2. Calcifications are noted over the right neck in the region of the right submandibular gland. Submandibular gland tumor could present in this fashion.    Pt was referrred to our clinic clinic by Dr. Windy Kalata for further evaluation of the patient newly diagnosed monoclonal paraproteinemia.  Outside labs not show any anemia, elevated creatinine or hypercalcemia. He does not have any focal bone pains in his spine and ribs or skull.    INTERVAL HISTORY  Pt presents to the office today accompanied by his wife. He reports that he is doing well overall.  Bone scan was completed on 08/28/2017 and did not suggest highlight as evidence of concerning bone lesions. He has not completed24h UPEP yet and will collect this. We discussed consideration of bone marrow aspirate/ biopsy but he would like to hold off at this time. Pt is asymptomatic at this time.    On review of systems, pt denies fever, chills, and any other accompanying symptoms.Marland Kitchen     MEDICAL HISTORY:  Past Medical History:  Diagnosis Date  . Diabetes ([place])   . HTN (hypertension)   . Hypokalemia     SURGICAL HISTORY: Past Surgical History:  Procedure Laterality Date  . COLONOSCOPY  2018    SOCIAL HISTORY: Social History   Social History  . Marital status: Married    Spouse name: N/A  . Number of children: N/A  . Years of education: N/A   Occupational History  . Not on file.   Social History Main Topics  . Smoking status: Never Smoker  . Smokeless tobacco: Never Used  . Alcohol use Yes     Comment: Social - weekly  . Drug use: No  . Sexual activity: Yes   Other Topics Concern  . Not on file   Social History Narrative  . No narrative on file    FAMILY HISTORY: Family History  Problem Relation Age of Onset  . Thyroid disease Sister   . Diabetes Sister        Type II DM  . Heart attack Mother        Cause of death  . Heart disease Mother   . Heart  disease Father   . Healthy Sister     ALLERGIES:  has no allergies on file.  MEDICATIONS:  Current Outpatient Prescriptions  Medication Sig Dispense Refill  . amLODipine (NORVASC) 10 MG tablet TK 1 T PO QD  1  . Ascorbic Acid (VITAMIN C) 1000 MG tablet Take 1,000 mg by mouth daily.    Marland Kitchen aspirin EC 81 MG tablet Take 81 mg by mouth daily.    Marland Kitchen BAYER CONTOUR NEXT TEST test strip CHECK BLOOD SUGAR ONCE DAILY  0  . benazepril-hydrochlorthiazide (LOTENSIN HCT) 20-12.5 MG tablet TK 2 TS PO QD  3  . glimepiride (AMARYL) 4 MG tablet TK 1 T PO BID WC  1  . JANUVIA 100 MG tablet TK 1 T PO QD  3  . metFORMIN  (GLUCOPHAGE-XR) 500 MG 24 hr tablet TK 4 TS PO QAM  5  . metoprolol succinate (TOPROL-XL) 50 MG 24 hr tablet TK 2 TS PO QAM AND 2 T QPM  3  . potassium chloride (MICRO-K) 10 MEQ CR capsule TK 1 C PO QD WF  3  . WELCHOL 625 MG tablet TK 3 TS PO BID WC  3   No current facility-administered medications for this visit.     REVIEW OF SYSTEMS:   Eyes: Denies blurriness of vision, double vision or watery eyes Ears, nose, mouth, throat, and face: Denies mucositis or sore throat Respiratory: Denies cough, dyspnea or wheezes Cardiovascular: Denies palpitation, chest discomfort or lower extremity swelling Gastrointestinal:  Denies nausea, heartburn or change in bowel habits Skin: Denies abnormal skin rashes Lymphatics: Denies new lymphadenopathy or easy bruising Neurological: (+) chronic numbness to bilateral hands due to pinched cervical nerve. Denies tingling or new weaknesses Behavioral/Psych: Mood is stable, no new changes  All other systems were reviewed with the patient and are negative.  PHYSICAL EXAMINATION:  ECOG PERFORMANCE STATUS: 0 - Asymptomatic  Vitals:   09/05/17 1439  BP: (!) 160/77  Pulse: 72  Resp: 18  Temp: 98.2 F (36.8 C)  SpO2: 98%   Filed Weights   09/05/17 1439  Weight: 217 lb 9.6 oz (98.7 kg)    GENERAL:alert, no distress and comfortable SKIN: skin color, texture, turgor are normal, no rashes or significant lesions EYES: normal, conjunctiva are pink and non-injected, sclera clear OROPHARYNX:no exudate, no erythema and lips, buccal mucosa, and tongue normal  NECK: supple, thyroid normal size, non-tender, without nodularity LYMPH:  no palpable lymphadenopathy in the cervical, axillary or inguinal LUNGS: clear to auscultation and percussion with normal breathing effort HEART: regular rate & rhythm and no murmurs and no lower extremity edema ABDOMEN:abdomen soft, non-tender and normal bowel sounds Musculoskeletal:no cyanosis of digits and no clubbing  PSYCH:  alert & oriented x 3 with fluent speech NEURO: no focal motor/sensory deficits  LABORATORY DATA:  I have reviewed the data as listed  . CBC Latest Ref Rng & Units 08/06/2017 11/02/2008  WBC 4.0 - 10.3 10e3/uL 7.9 7.0  Hemoglobin 13.0 - 17.1 g/dL 15.0 15.1  Hematocrit 38.4 - 49.9 % 42.8 43.4  Platelets 140 - 400 10e3/uL 180 195   . CMP Latest Ref Rng & Units 08/06/2017 08/06/2017  Glucose 70 - 140 mg/dl 262(H) -  BUN 7.0 - 26.0 mg/dL 13.1 -  Creatinine 0.7 - 1.3 mg/dL 1.1 -  Sodium 136 - 145 mEq/L 138 -  Potassium 3.5 - 5.1 mEq/L 3.6 -  CO2 22 - 29 mEq/L 30(H) -  Calcium 8.4 - 10.4 mg/dL 10.1 -  Total  Protein 6.0 - 8.5 g/dL 8.0 7.4  Total Bilirubin 0.20 - 1.20 mg/dL 1.16 -  Alkaline Phos 40 - 150 U/L 63 -  AST 5 - 34 U/L 48(H) -  ALT 0 - 55 U/L 99(H) -   Component     Latest Ref Rng & Units 08/06/2017  Sed Rate     0 - 30 mm/hr 5  LDH     125 - 245 U/L 210  HIV Screen 4th Generation wRfx     Non Reactive Non Reactive  Hep C Virus Ab     0.0 - 0.9 s/co ratio <0.1     RADIOGRAPHIC STUDIES: I have personally reviewed the radiological images as listed and agreed with the findings in the report. Nm Bone Scan Whole Body  Result Date: 08/28/2017 CLINICAL DATA:  The patient had a recent bone survey for monoclonal gammopathy. Lucencies scattered throughout the skull were described on the single lateral view. No other bony abnormalities are noted. EXAM: NUCLEAR MEDICINE WHOLE BODY BONE SCAN TECHNIQUE: Whole body anterior and posterior images were obtained approximately 3 hours after intravenous injection of radiopharmaceutical. RADIOPHARMACEUTICALS:  20.0 mCi Technetium-82mMDP IV COMPARISON:  Skeletal survey June 22, 2017 FINDINGS: There is a focus of uptake along the right supra orbital rim. Uptake in the left maxilla is likely odontogenic. There is also subtle focal increased uptake over the right maxilla. No other abnormal uptake in the skull. Scattered degenerative changes including  the AC joints, the sternal manubrial junction, the knees, the feet, and spine. Spinal degenerative changes are most prominent in the lumbar region. No other abnormal bony uptake is identified. Soft tissue uptake is normal. IMPRESSION: 1. There is focal uptake in the right supra orbital rim which is nonspecific. It is unclear whether this correlates with 1 of the lucencies seen on a recent skeletal survey as only a lateral view was obtained at that time. Bone scan sensitivity is limited in the setting of purely lytic metastases or multiple myeloma. 2. Odontogenic change in the left maxilla. 3. Mild focal uptake over the right maxillary bone is nonspecific but may be odontogenic or secondary to underlying sinus disease. Electronically Signed   By: DDorise BullionIII M.D   On: 08/28/2017 15:19      DG Bone Survey Met, 06/22/2017 IMPRESSION: 1. Lucencies noted scattered throughout the skull. Myeloma and/or metastatic disease could present this fashion. No other significant bony lucencies are identified. 2. Calcifications are noted over the right neck in the region of the right submandibular gland. Submandibular gland tumor could present in this fashion. Nonenhanced and enhanced maxillofacial CT is suggested for further evaluation. 3. Bilateral carotid atherosclerotic vascular disease.    ASSESSMENT & PLAN:  56y.o. male with   #1 IgG kappa monoclonal paraproteinemia Patient has no anemia and no overt renal failure and no hypercalcemia at this time. He has multiple indeterminate on a lucencies in the skull which  On bone scan due not show up as being active bone lesions. His M spike was only 0.4 g/dL however the serum kappa lambda ratio is normal at 8. Cannot rule out a plasma cell dyscrasia.  #2  Non nephrotic Range proteinuria -clinically this appears to likely be related to diabetic nephropathy with additional comorbidities of hypertension. Unlikely to be related to plasma cell dyscrasia though  with abnormalities in kappa/ lambda ratio cannot rule out possible of AL Amyloidosis. The decision for further working up his proteinuria with a kidney biopsy would be a  determination that could be deferred to the nephrologist based on clinical suspicion given patient has other obvious explanations. Plan -Discussed in detail regarding the patient recent labwork and the significance and possible implications of finding a monoclonal paraproteinemia and bone scan results. -We discussed that this likely represents MGUS but other plasma cell dyscrasias would need to be considered and ruled out. - 24 hour urine collection for UPEP -pending. -PET scan was ordered to evaluate and r/o multiple myeloma in the setting of indeterminate bone lesion on skeletal survey and r/o other lesions --- denied by insurance company. -no other focal symptoms to suggest metastatic disease. -patient would like to hold off on Bone marrow biopsy at this time. --Pt also elected not to have a full body CT scan at this time to r/o other primary tumor in the setitng of indeterminate bone lesions.   Orders Placed This Encounter  Procedures  . CBC & Diff and Retic    Standing Status:   Future    Standing Expiration Date:   09/05/2018  . Comprehensive metabolic panel    Standing Status:   Future    Standing Expiration Date:   09/05/2018  . Multiple Myeloma Panel (SPEP&IFE w/QIG)    Standing Status:   Future    Standing Expiration Date:   09/05/2018  . Kappa/lambda light chains    Standing Status:   Future    Standing Expiration Date:   10/10/2018  . Lactate dehydrogenase    Standing Status:   Future    Standing Expiration Date:   09/05/2018  . Beta 2 microglobulin    Standing Status:   Future    Standing Expiration Date:   09/05/2018  . 24-Hr Ur UPEP/UIFE/Light Chains/TP    Standing Status:   Future    Number of Occurrences:   1    Standing Expiration Date:   09/05/2018   Plan: -Labs today to pickup container for  24h urine   RTC with Dr Irene Limbo in 4 months with labs   All questions were answered. The patient knows to call the clinic with any problems, questions or concerns. I spent 20 minutes counseling the patient face to face. The total time spent in the appointment was 25  minutes and more than 50% was on counseling.    Sullivan Lone MD [place] AAHIVMS [facility] Hematology/Oncology Physician [place]  (Office):       [phone] (Work cell):  [phone] (Fax):           [phone]   This document serves as a record of services personally performed by Sullivan Lone, MD. It was created on her behalf by Alean Rinne, a trained medical scribe. The creation of this record is based on the scribe's personal observations and the provider's statements to them. This document has been checked and approved by the attending provider.

## 2017-09-05 ENCOUNTER — Encounter: Payer: Self-pay | Admitting: Hematology

## 2017-09-05 ENCOUNTER — Telehealth: Payer: Self-pay | Admitting: Hematology

## 2017-09-05 ENCOUNTER — Ambulatory Visit (HOSPITAL_BASED_OUTPATIENT_CLINIC_OR_DEPARTMENT_OTHER): Payer: BLUE CROSS/BLUE SHIELD | Admitting: Hematology

## 2017-09-05 VITALS — BP 160/77 | HR 72 | Temp 98.2°F | Resp 18 | Ht 69.0 in | Wt 217.6 lb

## 2017-09-05 DIAGNOSIS — D472 Monoclonal gammopathy: Secondary | ICD-10-CM | POA: Diagnosis not present

## 2017-09-05 DIAGNOSIS — R809 Proteinuria, unspecified: Secondary | ICD-10-CM

## 2017-09-05 DIAGNOSIS — M899 Disorder of bone, unspecified: Secondary | ICD-10-CM | POA: Diagnosis not present

## 2017-09-05 DIAGNOSIS — I10 Essential (primary) hypertension: Secondary | ICD-10-CM | POA: Diagnosis not present

## 2017-09-05 NOTE — Telephone Encounter (Signed)
Gave patient avs report and appointments for February. Patient sent back to lab for 24hr urine container.

## 2017-09-10 DIAGNOSIS — D472 Monoclonal gammopathy: Secondary | ICD-10-CM | POA: Diagnosis not present

## 2017-09-11 LAB — UPEP/UIFE/LIGHT CHAINS/TP, 24-HR UR
% BETA, Urine: 10.8 %
ALBUMIN, U: 78.5 %
ALPHA 1 URINE: 3.5 %
ALPHA-2-GLOBULIN, U: 3.5 %
Free Kappa Lt Chains,Ur: 32.8 mg/L — ABNORMAL HIGH (ref 1.35–24.19)
Free Lambda Lt Chains,Ur: 2.96 mg/L (ref 0.24–6.66)
GAMMA GLOBULIN URINE: 3.7 %
KAPPA/LAMBDA RATIO, U: 11.08 — AB (ref 2.04–10.37)
PROTEIN,TOTAL,URINE: 51.8 mg/dL

## 2017-09-17 DIAGNOSIS — L84 Corns and callosities: Secondary | ICD-10-CM | POA: Diagnosis not present

## 2017-09-17 DIAGNOSIS — L821 Other seborrheic keratosis: Secondary | ICD-10-CM | POA: Diagnosis not present

## 2017-09-20 DIAGNOSIS — M9901 Segmental and somatic dysfunction of cervical region: Secondary | ICD-10-CM | POA: Diagnosis not present

## 2017-09-20 DIAGNOSIS — M5033 Other cervical disc degeneration, cervicothoracic region: Secondary | ICD-10-CM | POA: Diagnosis not present

## 2017-09-20 DIAGNOSIS — M531 Cervicobrachial syndrome: Secondary | ICD-10-CM | POA: Diagnosis not present

## 2017-09-20 DIAGNOSIS — M5032 Other cervical disc degeneration, mid-cervical region, unspecified level: Secondary | ICD-10-CM | POA: Diagnosis not present

## 2017-09-23 DIAGNOSIS — Z23 Encounter for immunization: Secondary | ICD-10-CM | POA: Diagnosis not present

## 2017-10-15 DIAGNOSIS — M531 Cervicobrachial syndrome: Secondary | ICD-10-CM | POA: Diagnosis not present

## 2017-10-15 DIAGNOSIS — M9901 Segmental and somatic dysfunction of cervical region: Secondary | ICD-10-CM | POA: Diagnosis not present

## 2017-10-15 DIAGNOSIS — M5033 Other cervical disc degeneration, cervicothoracic region: Secondary | ICD-10-CM | POA: Diagnosis not present

## 2017-10-15 DIAGNOSIS — M5032 Other cervical disc degeneration, mid-cervical region, unspecified level: Secondary | ICD-10-CM | POA: Diagnosis not present

## 2017-10-24 DIAGNOSIS — L84 Corns and callosities: Secondary | ICD-10-CM | POA: Diagnosis not present

## 2017-11-12 DIAGNOSIS — M531 Cervicobrachial syndrome: Secondary | ICD-10-CM | POA: Diagnosis not present

## 2017-11-12 DIAGNOSIS — M9901 Segmental and somatic dysfunction of cervical region: Secondary | ICD-10-CM | POA: Diagnosis not present

## 2017-11-12 DIAGNOSIS — M5032 Other cervical disc degeneration, mid-cervical region, unspecified level: Secondary | ICD-10-CM | POA: Diagnosis not present

## 2017-11-12 DIAGNOSIS — M5033 Other cervical disc degeneration, cervicothoracic region: Secondary | ICD-10-CM | POA: Diagnosis not present

## 2017-12-18 DIAGNOSIS — M531 Cervicobrachial syndrome: Secondary | ICD-10-CM | POA: Diagnosis not present

## 2017-12-18 DIAGNOSIS — M9901 Segmental and somatic dysfunction of cervical region: Secondary | ICD-10-CM | POA: Diagnosis not present

## 2017-12-18 DIAGNOSIS — M5032 Other cervical disc degeneration, mid-cervical region, unspecified level: Secondary | ICD-10-CM | POA: Diagnosis not present

## 2017-12-18 DIAGNOSIS — M5033 Other cervical disc degeneration, cervicothoracic region: Secondary | ICD-10-CM | POA: Diagnosis not present

## 2017-12-28 DIAGNOSIS — E559 Vitamin D deficiency, unspecified: Secondary | ICD-10-CM | POA: Diagnosis not present

## 2017-12-28 DIAGNOSIS — R809 Proteinuria, unspecified: Secondary | ICD-10-CM | POA: Diagnosis not present

## 2017-12-28 DIAGNOSIS — D472 Monoclonal gammopathy: Secondary | ICD-10-CM | POA: Diagnosis not present

## 2017-12-28 DIAGNOSIS — I10 Essential (primary) hypertension: Secondary | ICD-10-CM | POA: Diagnosis not present

## 2017-12-31 NOTE — Progress Notes (Signed)
Francis Silva  [phone]) [phone] Fax:([phone]) [place] HEMATOLOGY ONCOLOGY PROGRESS NOTE  DOS: 01/02/18   Patient Care Team: Hulan Fess, MD as PCP - General (Family Medicine)  CHIEF COMPLAINTS/PURPOSE OF CONSULTATION:  MGUS   HISTORY OF PRESENTING ILLNESS:   Francis Silva 57 y.o. male with a history of hypertension and diabetes who was noted to have proteinuria and was referred to nephrology for further evaluation.  Patient was seen by Dr. Ila Mcgill at [facility] for further evaluation of his non-nephrotic range proteinuria that was initially thought to be due to the patient hx of DM or HTN.  Urine protein creatinine ratio was 407 mg/g of creatinine As a part of the workup for proteinuria the patient had an SPEP which showed a M spike of 0.4 g/dL. Immunofixation showed IgG monoclonal protein with A light chain specificity. IgG levels were normal at 1172. Normal IgA and IgM levels noted. Increase in serum free kappa light chains at 81.8 with a serum lambda free light chains at 10 with an abnormal ratio of 8.02.  Pt had a DG Bone Survey Met completed on 06/22/2017 with results revealing: Lucencies noted scattered throughout the skull. Myeloma and/or metastatic disease could present this fashion. No other significant bony lucencies are identified. 2. Calcifications are noted over the right neck in the region of the right submandibular gland. Submandibular gland tumor could present in this fashion.    Pt was referrred to our clinic clinic by Dr. Windy Kalata for further evaluation of the patient newly diagnosed monoclonal paraproteinemia.  Outside labs not show any anemia, elevated creatinine or hypercalcemia. He does not have any focal bone pains in his spine and ribs or skull.    INTERVAL HISTORY  Francis Silva returns today regarding his IgG kappa monoclonal paraproteinemia. The patient's last  visit with [place] was on 09/05/17. 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 He reports no new bone pains, eating well, and no sudden weight changes.    Of note since the patient last visit, pt has had a 24Hr Ur UPEP completed on 09/10/17 with all values WNL except for Prot,24hr calculated at 1,554, Free Kappa lt chains,Ur at 32.80 and Kappa/Lambda ratio,u at 11.08.  Lab results today (01/02/18) of CBC, CMP, and Reticulocytes is as follows: all values are WNL except for Chloride at 97, Glucose at 291 (non-fasting), AST at 74, and ALT at 143. LDH is WNL.   MMP, and Kappa/lambda, are pending today 01/02/18.   On review of systems, pt reports sleeping well, eating well, and denies abdominal pains, leg swelling, headaches, pain along the spine, and new bone pains.     MEDICAL HISTORY:  Past Medical History:  Diagnosis Date  . Diabetes ([place])   . HTN (hypertension)   . Hypokalemia     SURGICAL HISTORY: Past Surgical History:  Procedure Laterality Date  . COLONOSCOPY  2018    SOCIAL HISTORY: Social History   Socioeconomic History  . Marital status: Married    Spouse name: Not on file  . Number of children: Not on file  . Years of education: Not on file  . Highest education level: Not on file  Social Needs  . Financial resource strain: Not on file  . Food insecurity - worry: Not on file  . Food insecurity - inability: Not on file  . Transportation needs - medical:  Not on file  . Transportation needs - non-medical: Not on file  Occupational History  . Not on file  Tobacco Use  . Smoking status: Never Smoker  . Smokeless tobacco: Never Used  Substance and Sexual Activity  . Alcohol use: Yes    Comment: Social - weekly  . Drug use: No  . Sexual activity: Yes  Other Topics Concern  . Not on file  Social History Narrative  . Not on file    FAMILY HISTORY: Family History  Problem Relation Age of Onset  . Thyroid disease  Sister   . Diabetes Sister        Type II DM  . Heart attack Mother        Cause of death  . Heart disease Mother   . Heart disease Father   . Healthy Sister     ALLERGIES:  has No Known Allergies.  MEDICATIONS:  Current Outpatient Medications  Medication Sig Dispense Refill  . amLODipine (NORVASC) 10 MG tablet TK 1 T PO QD  1  . Ascorbic Acid (VITAMIN C) 1000 MG tablet Take 1,000 mg by mouth daily.    Marland Kitchen aspirin EC 81 MG tablet Take 81 mg by mouth daily.    Marland Kitchen BAYER CONTOUR NEXT TEST test strip CHECK BLOOD SUGAR ONCE DAILY  0  . benazepril-hydrochlorthiazide (LOTENSIN HCT) 20-12.5 MG tablet TK 2 TS PO QD  3  . glimepiride (AMARYL) 4 MG tablet TK 1 T PO BID WC  1  . JANUVIA 100 MG tablet TK 1 T PO QD  3  . metFORMIN (GLUCOPHAGE-XR) 500 MG 24 hr tablet TK 4 TS PO QAM  5  . metoprolol succinate (TOPROL-XL) 50 MG 24 hr tablet TK 2 TS PO QAM AND 2 T QPM  3  . potassium chloride (MICRO-K) 10 MEQ CR capsule TK 1 C PO QD WF  3  . WELCHOL 625 MG tablet TK 3 TS PO BID WC  3   No current facility-administered medications for this visit.     REVIEW OF SYSTEMS:   Eyes: Denies blurriness of vision, double vision or watery eyes Ears, nose, mouth, throat, and face: Denies mucositis or sore throat Respiratory: Denies cough, dyspnea or wheezes Cardiovascular: Denies palpitation, chest discomfort or lower extremity swelling Gastrointestinal:  Denies nausea, heartburn or change in bowel habits Skin: Denies abnormal skin rashes Lymphatics: Denies new lymphadenopathy or easy bruising Neurological: (+) chronic numbness to bilateral hands due to pinched cervical nerve. Denies tingling or new weaknesses Behavioral/Psych: Mood is stable, no new changes  All other systems were reviewed with the patient and are negative.  PHYSICAL EXAMINATION:  ECOG PERFORMANCE STATUS: 0 - Asymptomatic  There were no vitals filed for this visit. There were no vitals filed for this visit.  GENERAL:alert, no  distress and comfortable SKIN: skin color, texture, turgor are normal, no rashes or significant lesions EYES: normal, conjunctiva are pink and non-injected, sclera clear OROPHARYNX:no exudate, no erythema and lips, buccal mucosa, and tongue normal  NECK: supple, thyroid normal size, non-tender, without nodularity LYMPH:  no palpable lymphadenopathy in the cervical, axillary or inguinal LUNGS: clear to auscultation and percussion with normal breathing effort HEART: regular rate & rhythm and no murmurs and no lower extremity edema ABDOMEN:abdomen soft, non-tender and normal bowel sounds Musculoskeletal:no cyanosis of digits and no clubbing  PSYCH: alert & oriented x 3 with fluent speech NEURO: no focal motor/sensory deficits  LABORATORY DATA:  I have reviewed the data as listed  .  CBC Latest Ref Rng & Units 01/02/2018 08/06/2017 11/02/2008  WBC 4.0 - 10.3 K/uL 7.3 7.9 7.0  Hemoglobin 13.0 - 17.1 g/dL - 15.0 15.1  Hematocrit 38.4 - 49.9 % 41.9 42.8 43.4  Platelets 140 - 400 K/uL 188 180 195   . CMP Latest Ref Rng & Units 01/02/2018 08/06/2017 08/06/2017  Glucose 70 - 140 mg/dL 291(H) 262(H) -  BUN 7 - 26 mg/dL 15 13.1 -  Creatinine 0.70 - 1.30 mg/dL 1.20 1.1 -  Sodium 136 - 145 mmol/L 136 138 -  Potassium 3.5 - 5.1 mmol/L 3.5 3.6 -  Chloride 98 - 109 mmol/L 97(L) - -  CO2 22 - 29 mmol/L 26 30(H) -  Calcium 8.4 - 10.4 mg/dL 9.5 10.1 -  Total Protein 6.4 - 8.3 g/dL 7.4 8.0 7.4  Total Bilirubin 0.2 - 1.2 mg/dL 0.7 1.16 -  Alkaline Phos 40 - 150 U/L 72 63 -  AST 5 - 34 U/L 74(H) 48(H) -  ALT 0 - 55 U/L 143(H) 99(H) -   Component     Latest Ref Rng & Units 08/06/2017  Sed Rate     0 - 30 mm/hr 5  LDH     125 - 245 U/L 210  HIV Screen 4th Generation wRfx     Non Reactive Non Reactive  Hep C Virus Ab     0.0 - 0.9 s/co ratio <0.1     Component     Latest Ref Rng & Units 08/06/2017 09/10/2017 01/02/2018  IgG (Immunoglobin G), Serum     700 - 1600 mg/dL 1,015    IgA/Immunoglobulin A,  Serum     90 - 386 mg/dL 122    IgM, Qn, Serum     20 - 172 mg/dL 36    Total Protein     6.0 - 8.5 g/dL 7.4    Albumin SerPl Elph-Mcnc     2.9 - 4.4 g/dL 4.2    Alpha 1     0.0 - 0.4 g/dL 0.2    Alpha2 Glob SerPl Elph-Mcnc     0.4 - 1.0 g/dL 0.9    B-Globulin SerPl Elph-Mcnc     0.7 - 1.3 g/dL 1.1    Gamma Glob SerPl Elph-Mcnc     0.4 - 1.8 g/dL 0.9    M Protein SerPl Elph-Mcnc     Not Observed g/dL 0.4 (H)    Globulin, Total     2.2 - 3.9 g/dL 3.2    Albumin/Glob SerPl     0.7 - 1.7 1.4    IFE 1      Comment    Please Note (HCV):      Comment    Protein Urine Random     Not Estab. mg/dL  51.8   Prot,24hr calculated     30 - 150 mg/24 hr  1,554 (H)   ALBUMIN, U     %  78.5   ALPHA 1 URINE     %  3.5   ALPHA-2-GLOBULIN, U     %  3.5   % BETA, Urine     %  10.8   GAMMA GLOBULIN URINE     %  3.7   M-SPIKE, %     Not Observed %  Not Observed   Immunofixation Result, Urine       Comment   NOTE:       Comment   Free Kappa Lt Chains,Ur     1.35 - 24.19 mg/L  32.80 (H)   Free Lambda Lt Chains,Ur     0.24 - 6.66 mg/L  2.96   Kappa/Lambda Ratio,U     2.04 - 10.37  11.08 (H)   Kappa free light chain     3.3 - 19.4 mg/L   91.7 (H)  Lamda free light chains     5.7 - 26.3 mg/L   10.0  Kappa, lamda light chain ratio     0.26 - 1.65   9.17 (H)  Beta-2 Microglobulin     0.6 - 2.4 mg/L   1.3  LDH     125 - 245 U/L   200    Multiple Myeloma Panel (SPEP&IFE w/QIG)      No reference range information available      Resulting Lab: [facility]      Comments:           Immunofixation shows IgG monoclonal protein with kappa light           chain           Specificity.  RADIOGRAPHIC STUDIES: I have personally reviewed the radiological images as listed and agreed with the findings in the report. No results found.    DG Bone Survey Met, 06/22/2017 IMPRESSION: 1. Lucencies noted scattered throughout the skull. Myeloma and/or metastatic disease could present this  fashion. No other significant bony lucencies are identified. 2. Calcifications are noted over the right neck in the region of the right submandibular gland. Submandibular gland tumor could present in this fashion. Nonenhanced and enhanced maxillofacial CT is suggested for further evaluation. 3. Bilateral carotid atherosclerotic vascular disease.  Whole body bone scan: 08/28/2018 IMPRESSION: 1. There is focal uptake in the right supra orbital rim which is nonspecific. It is unclear whether this correlates with 1 of the lucencies seen on a recent skeletal survey as only a lateral view was obtained at that time. Bone scan sensitivity is limited in the setting of purely lytic metastases or multiple myeloma. 2. Odontogenic change in the left maxilla. 3. Mild focal uptake over the right maxillary bone is nonspecific but may be odontogenic or secondary to underlying sinus disease.   Electronically Signed   By: Dorise Bullion III M.D   On: 08/28/2017 15:19  PET/CT -ordered - refused by insurance company  ASSESSMENT & PLAN:   57 y.o. male with   #1 IgG kappa monoclonal paraproteinemia Patient has no anemia and no overt renal failure and no hypercalcemia at this time. He has multiple indeterminate on a lucencies in the skull which  On bone scan do not show up as being active bone lesions. PET/CT was ordered and declined by his insurance company. His M spike was only 0.4 g/dL however the serum kappa lambda ratio is abnormal at 8. Cannot rule out a plasma cell dyscrasia.  #2  Non nephrotic Range proteinuria -clinically this appears to likely be related to diabetic nephropathy with additional comorbidities of hypertension. Unlikely to be related to plasma cell dyscrasia though with abnormalities in kappa/ lambda ratio cannot rule out possible of AL Amyloidosis. The decision for further working up his proteinuria with a kidney biopsy would be a determination that could be deferred to the  nephrologist based on clinical suspicion given patient has other obvious explanations. Plan -patient has no clinical symptoms suggestive of progression to myeloma. -labs show no anemia, worsening renal function or hypercalcemia. -pending rpt SPEP. -no other focal symptoms to suggest metastatic disease. -patient would like to hold off on Bone  marrow biopsy at this time. -Discussed pt labwork today, uncontrolled blood sugars, normal CBC, normal kidney numbers.  -Discussed pt's proteinuria, but that the proteins are normal and likely a result of his DM.  -Discussed that if his pending labs continue to look good, we can switch from 4 month to 6 month f/u.  RTC with Dr Irene Limbo in 6 months with labs  All questions were answered. The patient knows to call the clinic with any problems, questions or concerns. I spent 20 minutes counseling the patient face to face. The total time spent in the appointment was 20 minutes and more than 50% was on counseling.    Sullivan Lone MD [place] AAHIVMS [facility] Hematology/Oncology Physician [place]  (Office):       [phone] (Work cell):  [phone] (Fax):           [phone]  This document serves as a record of services personally performed by Sullivan Lone, MD. It was created on his behalf by Baldwin Jamaica, a trained medical scribe. The creation of this record is based on the scribe's personal observations and the provider's statements to them.   .I have reviewed the above documentation for accuracy and completeness, and I agree with the above. Brunetta Genera MD MS

## 2018-01-02 ENCOUNTER — Inpatient Hospital Stay: Payer: BLUE CROSS/BLUE SHIELD | Attending: Hematology | Admitting: Hematology

## 2018-01-02 ENCOUNTER — Inpatient Hospital Stay: Payer: BLUE CROSS/BLUE SHIELD

## 2018-01-02 ENCOUNTER — Encounter: Payer: Self-pay | Admitting: Hematology

## 2018-01-02 VITALS — BP 151/93 | HR 78 | Temp 98.7°F | Resp 18 | Ht 69.0 in | Wt 216.8 lb

## 2018-01-02 DIAGNOSIS — R809 Proteinuria, unspecified: Secondary | ICD-10-CM | POA: Diagnosis not present

## 2018-01-02 DIAGNOSIS — I10 Essential (primary) hypertension: Secondary | ICD-10-CM

## 2018-01-02 DIAGNOSIS — D472 Monoclonal gammopathy: Secondary | ICD-10-CM

## 2018-01-02 DIAGNOSIS — E1140 Type 2 diabetes mellitus with diabetic neuropathy, unspecified: Secondary | ICD-10-CM

## 2018-01-02 LAB — COMPREHENSIVE METABOLIC PANEL
ALT: 143 U/L — AB (ref 0–55)
ANION GAP: 13 — AB (ref 3–11)
AST: 74 U/L — ABNORMAL HIGH (ref 5–34)
Albumin: 4.1 g/dL (ref 3.5–5.0)
Alkaline Phosphatase: 72 U/L (ref 40–150)
BUN: 15 mg/dL (ref 7–26)
CHLORIDE: 97 mmol/L — AB (ref 98–109)
CO2: 26 mmol/L (ref 22–29)
CREATININE: 1.2 mg/dL (ref 0.70–1.30)
Calcium: 9.5 mg/dL (ref 8.4–10.4)
Glucose, Bld: 291 mg/dL — ABNORMAL HIGH (ref 70–140)
POTASSIUM: 3.5 mmol/L (ref 3.5–5.1)
Sodium: 136 mmol/L (ref 136–145)
Total Bilirubin: 0.7 mg/dL (ref 0.2–1.2)
Total Protein: 7.4 g/dL (ref 6.4–8.3)

## 2018-01-02 LAB — CBC WITH DIFFERENTIAL (CANCER CENTER ONLY)
Basophils Absolute: 0 10*3/uL (ref 0.0–0.1)
Basophils Relative: 1 %
EOS ABS: 0.3 10*3/uL (ref 0.0–0.5)
Eosinophils Relative: 4 %
HCT: 41.9 % (ref 38.4–49.9)
HEMOGLOBIN: 14.9 g/dL (ref 13.0–17.1)
LYMPHS ABS: 1.7 10*3/uL (ref 0.9–3.3)
Lymphocytes Relative: 23 %
MCH: 30.7 pg (ref 27.2–33.4)
MCHC: 35.6 g/dL (ref 32.0–36.0)
MCV: 86.2 fL (ref 79.3–98.0)
Monocytes Absolute: 0.5 10*3/uL (ref 0.1–0.9)
Monocytes Relative: 7 %
NEUTROS ABS: 4.8 10*3/uL (ref 1.5–6.5)
NEUTROS PCT: 65 %
Platelet Count: 188 10*3/uL (ref 140–400)
RBC: 4.86 MIL/uL (ref 4.20–5.82)
RDW: 11.9 % (ref 11.0–14.6)
WBC: 7.3 10*3/uL (ref 4.0–10.3)

## 2018-01-02 LAB — RETICULOCYTES
RBC.: 4.86 MIL/uL (ref 4.20–5.82)
RETIC CT PCT: 1.2 % (ref 0.8–1.8)
Retic Count, Absolute: 58.3 10*3/uL (ref 34.8–93.9)

## 2018-01-02 LAB — LACTATE DEHYDROGENASE: LDH: 200 U/L (ref 125–245)

## 2018-01-02 NOTE — Patient Instructions (Signed)
Thank you for choosing [facility] to provide your oncology and hematology care.  To afford each patient quality time with our providers, please arrive 30 minutes before your scheduled appointment time.  If you arrive late for your appointment, you may be asked to reschedule.  We strive to give you quality time with our providers, and arriving late affects you and other patients whose appointments are after yours.   If you are a no show for multiple scheduled visits, you may be dismissed from the clinic at the providers discretion.    Again, thank you for choosing [facility], our hope is that these requests will decrease the amount of time that you wait before being seen by our physicians.  ______________________________________________________________________  Should you have questions after your visit to the [facility], please contact our office at [phone] between the hours of 8:30 and 4:30 p.m.    Voicemails left after 4:30p.m will not be returned until the following business day.    For prescription refill requests, please have your pharmacy contact us directly.  Please also try to allow 48 hours for prescription requests.    Please contact the scheduling department for questions regarding scheduling.  For scheduling of procedures such as PET scans, CT scans, MRI, Ultrasound, etc please contact central scheduling at [phone].    Resources For Cancer Patients and Caregivers:   Oncolink.org:  A wonderful resource for patients and healthcare providers for information regarding your disease, ways to tract your treatment, what to expect, etc.     American Cancer Society:  800-227-2345  Can help patients locate various types of support and financial assistance  Cancer Care: 1-800-813-HOPE (4673) Provides financial assistance, online support groups, medication/co-pay assistance.    [place] DSS:  336-641-3447 Where to apply for food  stamps, Medicaid, and utility assistance  Medicare Rights Center: 800-333-4114 Helps people with Medicare understand their rights and benefits, navigate the Medicare system, and secure the quality healthcare they deserve  SCAT: 336-333-6589 [employer] [employer]'s shared-ride transportation service for eligible riders who have a disability that prevents them from riding the fixed route bus.    For additional information on assistance programs please contact our social worker:   Grier Hock/Abigail Elmore:  [phone]            

## 2018-01-03 LAB — KAPPA/LAMBDA LIGHT CHAINS
KAPPA FREE LGHT CHN: 91.7 mg/L — AB (ref 3.3–19.4)
KAPPA, LAMDA LIGHT CHAIN RATIO: 9.17 — AB (ref 0.26–1.65)
Lambda free light chains: 10 mg/L (ref 5.7–26.3)

## 2018-01-03 LAB — BETA 2 MICROGLOBULIN, SERUM: BETA 2 MICROGLOBULIN: 1.3 mg/L (ref 0.6–2.4)

## 2018-01-07 LAB — MULTIPLE MYELOMA PANEL, SERUM
ALBUMIN SERPL ELPH-MCNC: 3.8 g/dL (ref 2.9–4.4)
ALPHA 1: 0.2 g/dL (ref 0.0–0.4)
ALPHA2 GLOB SERPL ELPH-MCNC: 0.9 g/dL (ref 0.4–1.0)
Albumin/Glob SerPl: 1.3 (ref 0.7–1.7)
B-Globulin SerPl Elph-Mcnc: 1 g/dL (ref 0.7–1.3)
GLOBULIN, TOTAL: 3 g/dL (ref 2.2–3.9)
Gamma Glob SerPl Elph-Mcnc: 0.8 g/dL (ref 0.4–1.8)
IGA: 107 mg/dL (ref 90–386)
IGG (IMMUNOGLOBIN G), SERUM: 861 mg/dL (ref 700–1600)
IGM (IMMUNOGLOBULIN M), SRM: 28 mg/dL (ref 20–172)
M Protein SerPl Elph-Mcnc: 0.4 g/dL — ABNORMAL HIGH
Total Protein ELP: 6.8 g/dL (ref 6.0–8.5)

## 2018-01-15 DIAGNOSIS — M5033 Other cervical disc degeneration, cervicothoracic region: Secondary | ICD-10-CM | POA: Diagnosis not present

## 2018-01-15 DIAGNOSIS — M531 Cervicobrachial syndrome: Secondary | ICD-10-CM | POA: Diagnosis not present

## 2018-01-15 DIAGNOSIS — M5032 Other cervical disc degeneration, mid-cervical region, unspecified level: Secondary | ICD-10-CM | POA: Diagnosis not present

## 2018-01-15 DIAGNOSIS — M9901 Segmental and somatic dysfunction of cervical region: Secondary | ICD-10-CM | POA: Diagnosis not present

## 2018-01-22 DIAGNOSIS — E119 Type 2 diabetes mellitus without complications: Secondary | ICD-10-CM | POA: Diagnosis not present

## 2018-02-12 DIAGNOSIS — M9901 Segmental and somatic dysfunction of cervical region: Secondary | ICD-10-CM | POA: Diagnosis not present

## 2018-02-12 DIAGNOSIS — M531 Cervicobrachial syndrome: Secondary | ICD-10-CM | POA: Diagnosis not present

## 2018-02-12 DIAGNOSIS — M5032 Other cervical disc degeneration, mid-cervical region, unspecified level: Secondary | ICD-10-CM | POA: Diagnosis not present

## 2018-02-12 DIAGNOSIS — M5033 Other cervical disc degeneration, cervicothoracic region: Secondary | ICD-10-CM | POA: Diagnosis not present

## 2018-03-01 ENCOUNTER — Telehealth: Payer: Self-pay

## 2018-03-01 NOTE — Telephone Encounter (Signed)
Patient issue regarding billing/coding. Pt was seen on 01/02/18 for MD visit and labs. Same as visit on 08/06/17. Patient did not receive a bill back in September, but did receive one for February visit for $785.00. Pt has spoken with billing and did not receive any direction from contact. Email sent to Phineas Inches, per Kieth Brightly in the Patient [place]. Anderson Malta is Dealer of the coding department and patient concern sent to her in the event that she could help with coding side.   Spoke with pt to relay that communication is in process, but no answers at the moment. Pt verbalized understanding and plan to call back Monday afternoon for any further information.

## 2018-03-04 ENCOUNTER — Telehealth: Payer: Self-pay

## 2018-03-04 NOTE — Telephone Encounter (Signed)
Patient billing concern forwarded via email to Gaspar Bidding and Ulice Dash with Managed [employer].

## 2018-03-05 ENCOUNTER — Telehealth: Payer: Self-pay

## 2018-03-05 NOTE — Telephone Encounter (Signed)
Pt called in requesting update on billing query. Notified that question forwarded to managed care. No further information at this time.

## 2018-03-06 ENCOUNTER — Telehealth: Payer: Self-pay

## 2018-03-06 NOTE — Telephone Encounter (Signed)
Pt called again this morning requesting update on billing query. Per Gaspar Bidding, Managed Care, okay to share number and left VM for pt with this information.

## 2018-03-12 DIAGNOSIS — M5032 Other cervical disc degeneration, mid-cervical region, unspecified level: Secondary | ICD-10-CM | POA: Diagnosis not present

## 2018-03-12 DIAGNOSIS — M531 Cervicobrachial syndrome: Secondary | ICD-10-CM | POA: Diagnosis not present

## 2018-03-12 DIAGNOSIS — M5033 Other cervical disc degeneration, cervicothoracic region: Secondary | ICD-10-CM | POA: Diagnosis not present

## 2018-03-12 DIAGNOSIS — M9901 Segmental and somatic dysfunction of cervical region: Secondary | ICD-10-CM | POA: Diagnosis not present

## 2018-03-13 ENCOUNTER — Telehealth: Payer: Self-pay

## 2018-03-13 NOTE — Telephone Encounter (Signed)
Patient called regarding his bill for blood work he received from last visit. He had already spoken with Darlena in Billing Dept.  Feels he will have to cancel his upcoming appointment in August.  Informed patient to not cancel his appointment and to call the Billing Dept and speak to a supervisor or manager regarding this issue.

## 2018-03-21 ENCOUNTER — Telehealth: Payer: Self-pay | Admitting: *Deleted

## 2018-03-21 NOTE — Telephone Encounter (Signed)
Received voice mail message from patient stating,"I would like to cancel my August appointments because I can't afford to pay a huge bill for labs. I think Aldona Bar knows what I'm talking about. After last week, with the hoops and run around, I can't afford to pay for lab work. I many need to stop seeing Dr. Irene Limbo too. I need to arrange a different way to get lab work done cheaper. Please call me at [phone] to confirm you cancelled my appointment.

## 2018-03-25 ENCOUNTER — Telehealth: Payer: Self-pay

## 2018-03-25 NOTE — Telephone Encounter (Signed)
Sent in-basket to Gaspar Bidding, Admitting Manager, out-of-office until 03/26/18. Message regarding pt contact about billing changes and inability to pay for lab work without insurance coverage. Pt requested for future appointments to be cancelled at this time. Waiting to hear if Darlena able to get in touch with patient regarding concerns.

## 2018-03-26 ENCOUNTER — Encounter: Payer: Self-pay | Admitting: Endocrinology

## 2018-03-26 ENCOUNTER — Ambulatory Visit: Payer: BLUE CROSS/BLUE SHIELD | Admitting: Endocrinology

## 2018-03-26 VITALS — BP 144/82 | HR 66 | Resp 17 | Wt 208.8 lb

## 2018-03-26 DIAGNOSIS — E042 Nontoxic multinodular goiter: Secondary | ICD-10-CM | POA: Diagnosis not present

## 2018-03-26 NOTE — Telephone Encounter (Signed)
Its the patients choice on this one.

## 2018-03-26 NOTE — Progress Notes (Signed)
Subjective:    Patient ID: Francis Silva, male    DOB: [birth year], 57 y.o.   MRN: [id number]  HPI Pt returns for f/u of nodular thyroid (dx'ed 2015, incidentally on carotid US; thyroid US then showed multinodular goiter, with no nodule meeting criteria for bx; he is euthyroid off rx). pt states he feels well in general.  He does not notice the goiter.   Past Medical History:  Diagnosis Date  . Diabetes ([place])   . HTN (hypertension)   . Hypokalemia     Past Surgical History:  Procedure Laterality Date  . COLONOSCOPY  2018    Social History   Socioeconomic History  . Marital status: Married    Spouse name: Not on file  . Number of children: Not on file  . Years of education: Not on file  . Highest education level: Not on file  Occupational History  . Not on file  Social Needs  . Financial resource strain: Not on file  . Food insecurity:    Worry: Not on file    Inability: Not on file  . Transportation needs:    Medical: Not on file    Non-medical: Not on file  Tobacco Use  . Smoking status: Never Smoker  . Smokeless tobacco: Never Used  Substance and Sexual Activity  . Alcohol use: Yes    Comment: Social - weekly  . Drug use: No  . Sexual activity: Yes  Lifestyle  . Physical activity:    Days per week: Not on file    Minutes per session: Not on file  . Stress: Not on file  Relationships  . Social connections:    Talks on phone: Not on file    Gets together: Not on file    Attends religious service: Not on file    Active member of club or organization: Not on file    Attends meetings of clubs or organizations: Not on file    Relationship status: Not on file  . Intimate partner violence:    Fear of current or ex partner: Not on file    Emotionally abused: Not on file    Physically abused: Not on file    Forced sexual activity: Not on file  Other Topics Concern  . Not on file  Social History Narrative  . Not on file    Current Outpatient  Medications on File Prior to Visit  Medication Sig Dispense Refill  . amLODipine (NORVASC) 10 MG tablet TK 1 T PO QD  1  . Ascorbic Acid (VITAMIN C) 1000 MG tablet Take 1,000 mg by mouth daily.    Marland Kitchen aspirin EC 81 MG tablet Take 81 mg by mouth daily.    Marland Kitchen BAYER CONTOUR NEXT TEST test strip CHECK BLOOD SUGAR ONCE DAILY  0  . benazepril-hydrochlorthiazide (LOTENSIN HCT) 20-12.5 MG tablet TK 2 TS PO QD  3  . Cholecalciferol (VITAMIN D PO) Take 500 mg/day by mouth.    Marland Kitchen glimepiride (AMARYL) 4 MG tablet TK 1 T PO BID WC  1  . JANUVIA 100 MG tablet TK 1 T PO QD  3  . metFORMIN (GLUCOPHAGE-XR) 500 MG 24 hr tablet TK 4 TS PO QAM  5  . metoprolol succinate (TOPROL-XL) 100 MG 24 hr tablet TK 1 T PO BID  1  . potassium chloride (MICRO-K) 10 MEQ CR capsule TK 1 C PO QD WF  3  . WELCHOL 625 MG tablet TK 3 TS PO BID WC  3   No current facility-administered medications on file prior to visit.     No Known Allergies  Family History  Problem Relation Age of Onset  . Thyroid disease Sister   . Diabetes Sister        Type II DM  . Heart attack Mother        Cause of death  . Heart disease Mother   . Heart disease Father   . Healthy Sister     BP (!) 144/82   Pulse 66   Resp 17   Wt 208 lb 12.8 oz (94.7 kg)   SpO2 97%   BMI 30.83 kg/m   Review of Systems Denies neck pain    Objective:   Physical Exam VS: see vs page GEN: no distress NECK: There is no palpable thyroid enlargement.  No thyroid nodule is palpable.  No palpable lymphadenopathy at the anterior neck.      Assessment & Plan:  Nodular goiter, due for recheck. HTN: is noted today.  Pt will have rechecked soon, with Dr Rex Kras  Patient Instructions  Let's recheck the ultrasound.  you will receive a phone call, about a day and time for an appointment. Dr Rex Kras with recheck the thyroid blood test when you go back soon for your annual checkup. If these are good, Please come back for a follow-up appointment in 2 years.

## 2018-03-26 NOTE — Patient Instructions (Addendum)
Let's recheck the ultrasound.  you will receive a phone call, about a day and time for an appointment. Dr Rex Kras with recheck the thyroid blood test when you go back soon for your annual checkup. If these are good, Please come back for a follow-up appointment in 2 years.

## 2018-03-27 ENCOUNTER — Telehealth: Payer: Self-pay

## 2018-03-27 NOTE — Telephone Encounter (Signed)
LVM on pt phone confirming that August appts have been cancelled as requested. Dr. Irene Limbo aware. Pt has had issues with billing r/t lab work. Verbalized on VM that lab work may be completed at PCP office, but not [facility] as there have been issues in the past receiving these prior to visit per MD. If pt would like to reschedule or has any questions, he may call us back at [phone].

## 2018-04-04 ENCOUNTER — Ambulatory Visit
Admission: RE | Admit: 2018-04-04 | Discharge: 2018-04-04 | Disposition: A | Discharge status: Home / Self Care | Payer: BLUE CROSS/BLUE SHIELD | Source: Ambulatory Visit | Attending: Endocrinology | Admitting: Endocrinology

## 2018-04-04 DIAGNOSIS — E041 Nontoxic single thyroid nodule: Secondary | ICD-10-CM | POA: Diagnosis not present

## 2018-04-04 DIAGNOSIS — E042 Nontoxic multinodular goiter: Secondary | ICD-10-CM

## 2018-04-09 DIAGNOSIS — M531 Cervicobrachial syndrome: Secondary | ICD-10-CM | POA: Diagnosis not present

## 2018-04-09 DIAGNOSIS — M9901 Segmental and somatic dysfunction of cervical region: Secondary | ICD-10-CM | POA: Diagnosis not present

## 2018-04-09 DIAGNOSIS — M5033 Other cervical disc degeneration, cervicothoracic region: Secondary | ICD-10-CM | POA: Diagnosis not present

## 2018-04-09 DIAGNOSIS — M5032 Other cervical disc degeneration, mid-cervical region, unspecified level: Secondary | ICD-10-CM | POA: Diagnosis not present

## 2018-04-18 ENCOUNTER — Encounter: Payer: Self-pay | Admitting: Family Medicine

## 2018-04-18 DIAGNOSIS — E1329 Other specified diabetes mellitus with other diabetic kidney complication: Secondary | ICD-10-CM | POA: Diagnosis not present

## 2018-04-18 DIAGNOSIS — D472 Monoclonal gammopathy: Secondary | ICD-10-CM | POA: Diagnosis not present

## 2018-04-18 DIAGNOSIS — I10 Essential (primary) hypertension: Secondary | ICD-10-CM | POA: Diagnosis not present

## 2018-04-18 DIAGNOSIS — Z125 Encounter for screening for malignant neoplasm of prostate: Secondary | ICD-10-CM | POA: Diagnosis not present

## 2018-04-18 DIAGNOSIS — E1165 Type 2 diabetes mellitus with hyperglycemia: Secondary | ICD-10-CM | POA: Diagnosis not present

## 2018-04-18 DIAGNOSIS — E782 Mixed hyperlipidemia: Secondary | ICD-10-CM | POA: Diagnosis not present

## 2018-04-20 IMAGING — CR DG BONE SURVEY MET
8 of 10 series · 8 of 10 positions shown · non-contrast
Comparison: No prior.

CLINICAL DATA: Monoclonal gammopathy.

EXAM:
METASTATIC BONE SURVEY

[w chest pa]
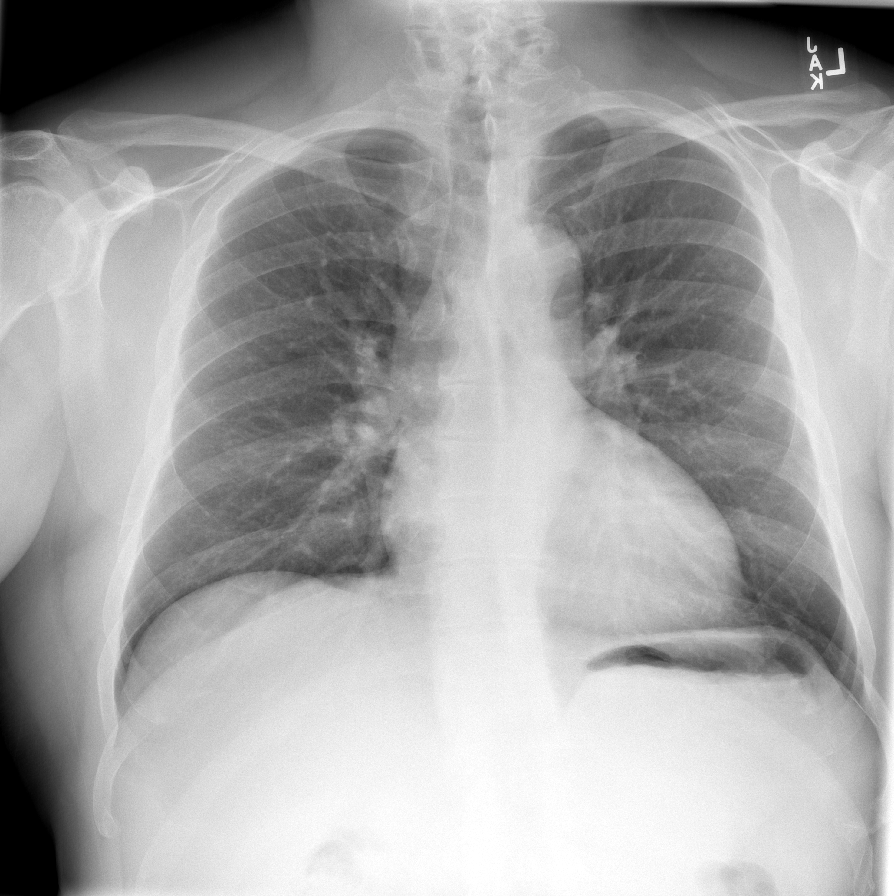

[w c-spine lat]
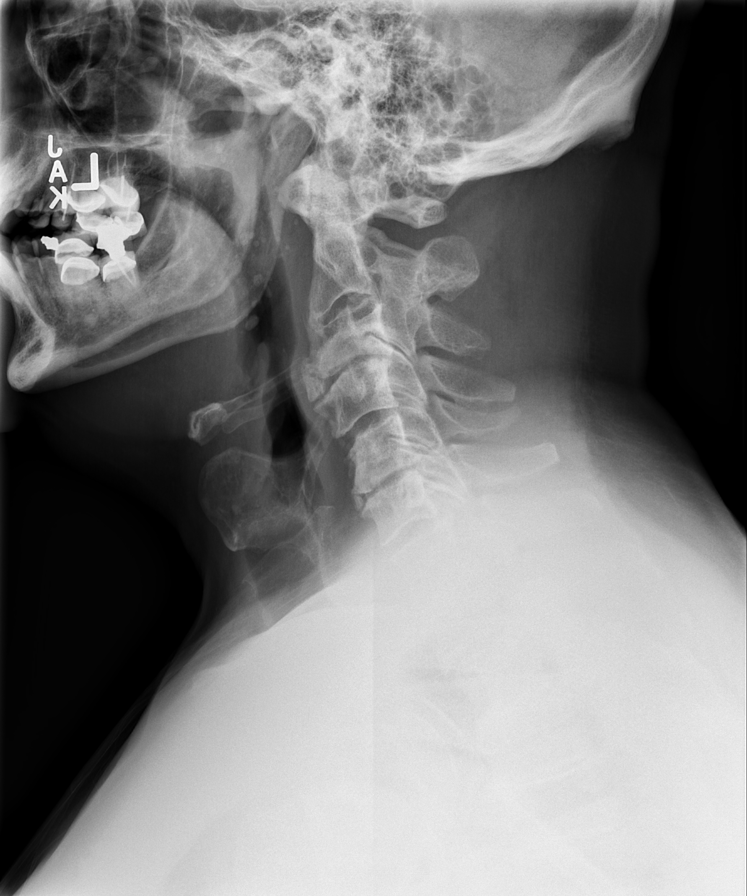

[w c-spine a.p. *]
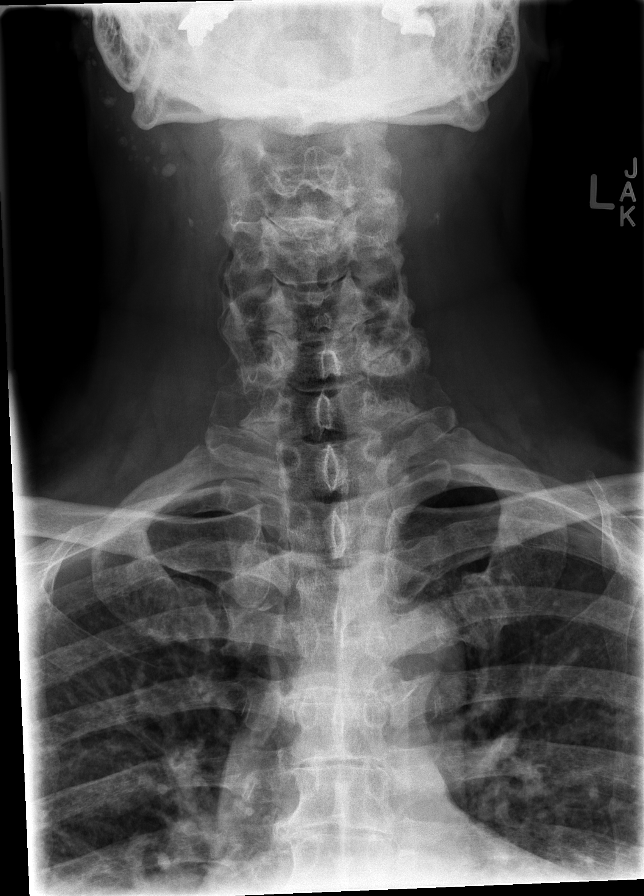

[w skull lat]
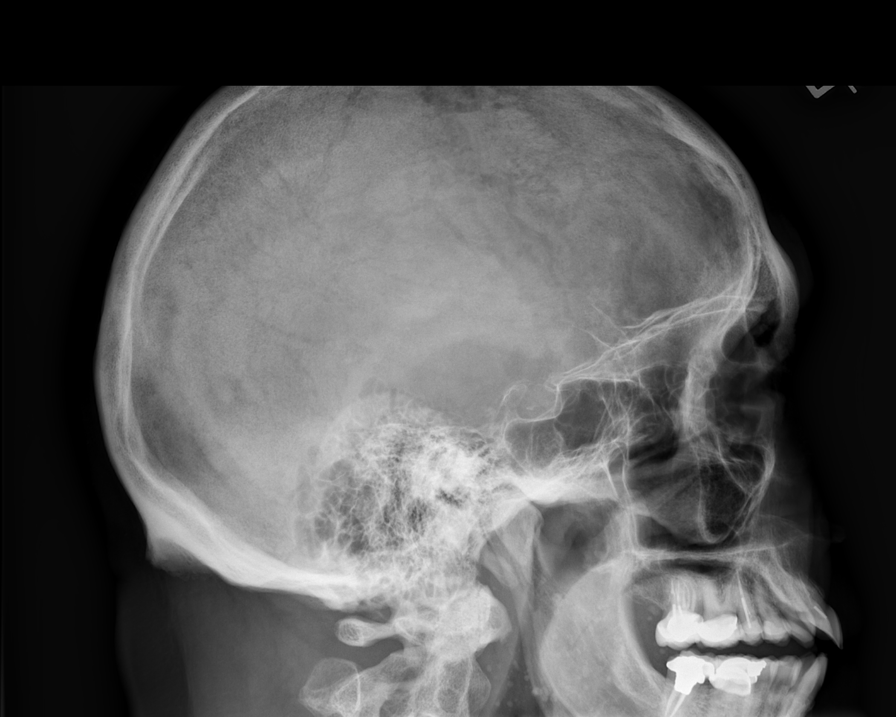

[w shoulder ap external righ]
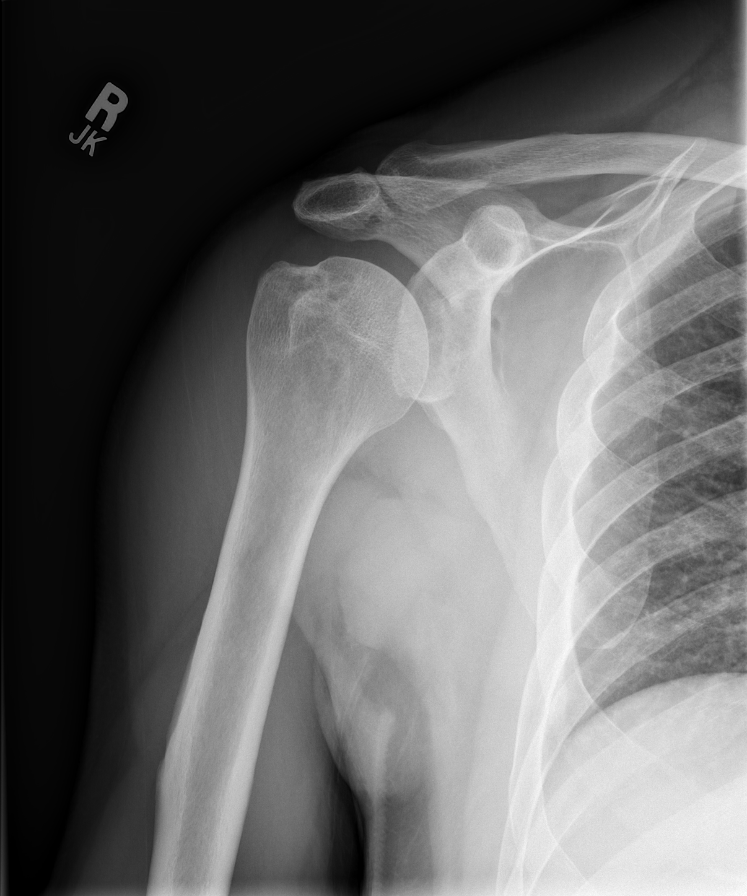

[w humerus ap right *]
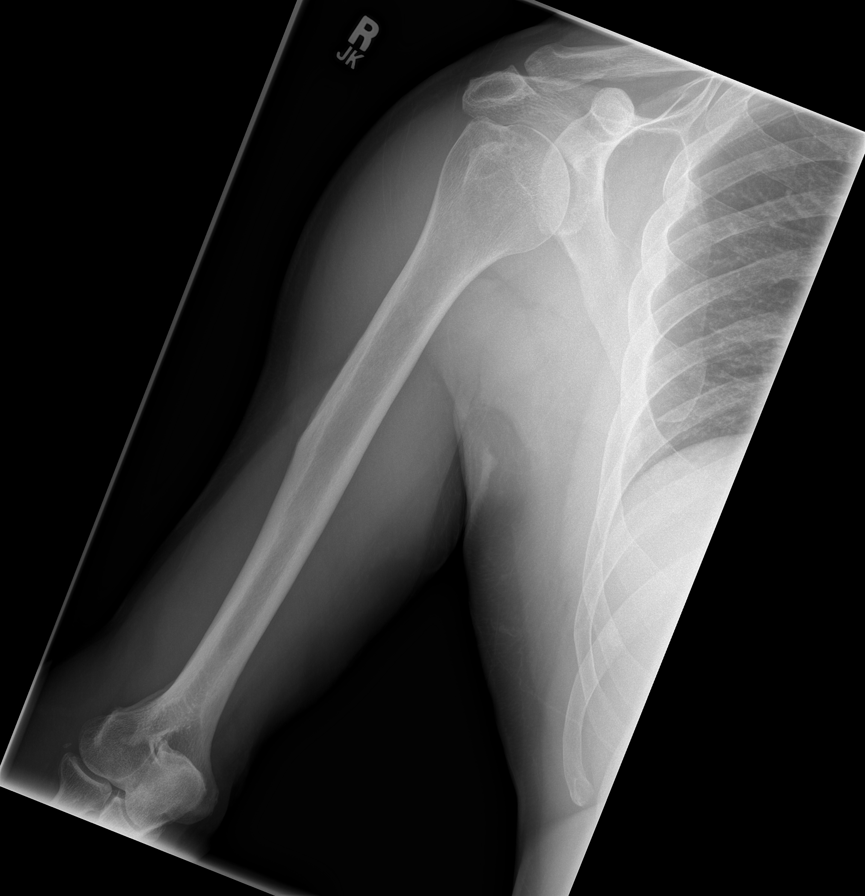

[w shoulder ap external left]
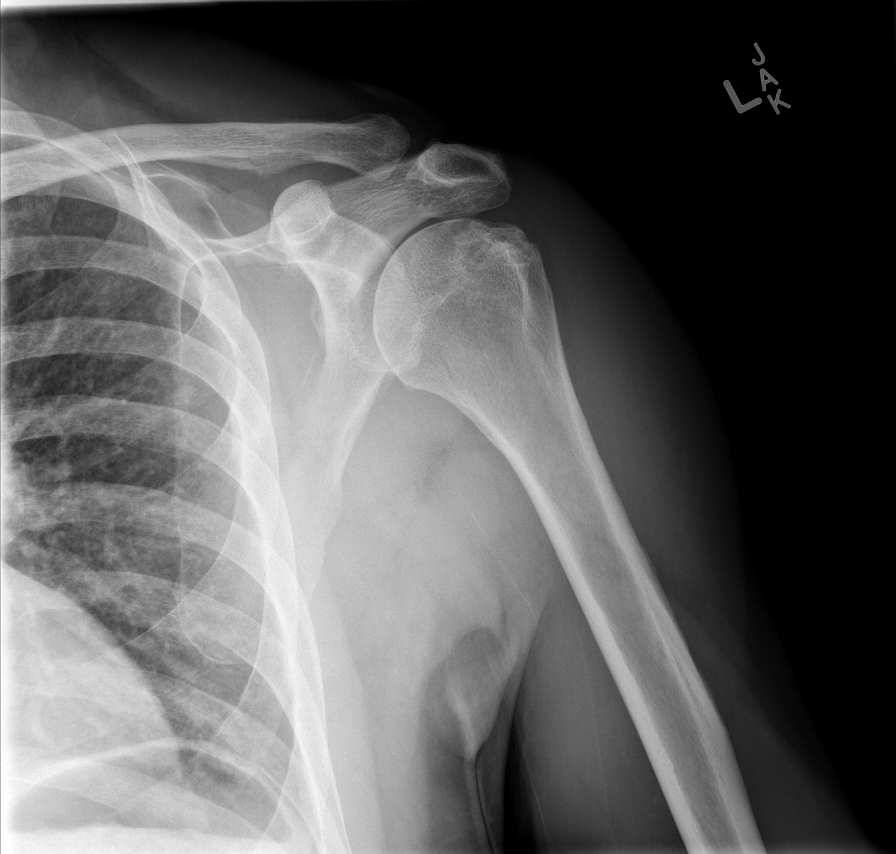

[w humerus ap left *]
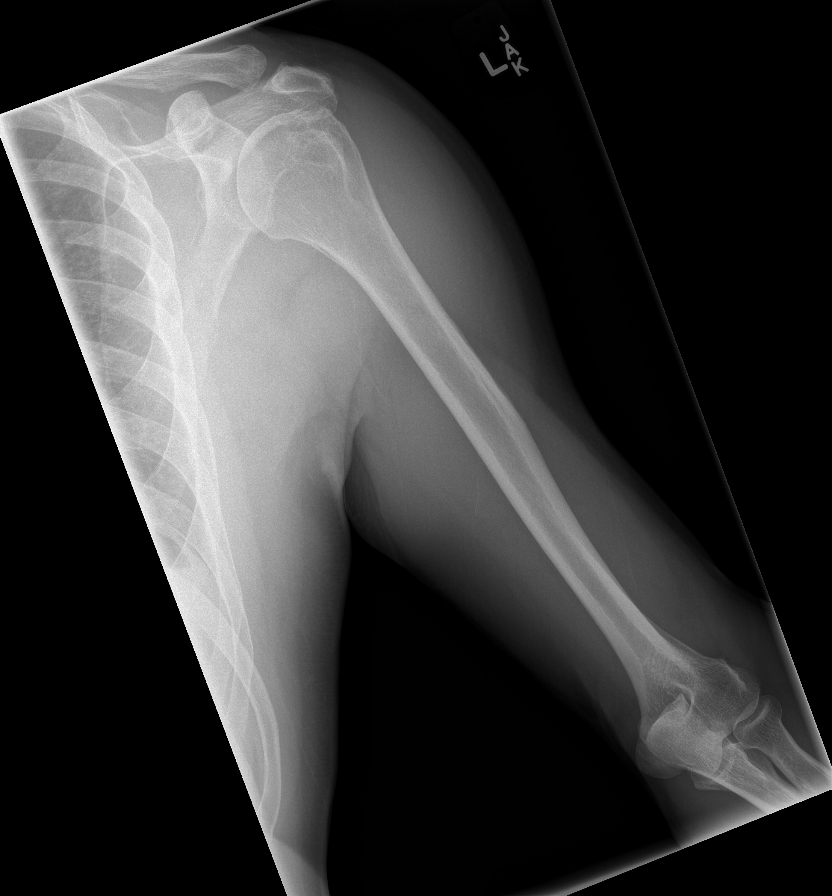

[8 of 10 positions shown; findings below may reference images not displayed]

FINDINGS: Multiple images are obtained of the axial and appendicular skeleton.
Focal lucencies are noted throughout the skull. No other definite
focal bony lucencies noted. Senescent changes noted throughout with
no prominent degenerative changes throughout the cervical, thoracic,
lumbar spine. Bilateral carotid vascular calcification noted.
Calcific densities are noted in the region of the submandibular
region. Nonenhanced and enhanced maxillofacial CT is suggested to
exclude tumor.
IMPRESSION: 1. Lucencies noted scattered throughout the skull. Myeloma and/or
metastatic disease could present this fashion. No other significant
bony lucencies are identified.

2. Calcifications are noted over the right neck in the region of the
right submandibular gland. Submandibular gland tumor could present
in this fashion. Nonenhanced and enhanced maxillofacial CT is
suggested for further evaluation.

3. Bilateral carotid atherosclerotic vascular disease .

## 2018-05-04 DIAGNOSIS — S60861A Insect bite (nonvenomous) of right wrist, initial encounter: Secondary | ICD-10-CM | POA: Diagnosis not present

## 2018-05-07 DIAGNOSIS — M531 Cervicobrachial syndrome: Secondary | ICD-10-CM | POA: Diagnosis not present

## 2018-05-07 DIAGNOSIS — M5033 Other cervical disc degeneration, cervicothoracic region: Secondary | ICD-10-CM | POA: Diagnosis not present

## 2018-05-07 DIAGNOSIS — M9901 Segmental and somatic dysfunction of cervical region: Secondary | ICD-10-CM | POA: Diagnosis not present

## 2018-05-07 DIAGNOSIS — M5032 Other cervical disc degeneration, mid-cervical region, unspecified level: Secondary | ICD-10-CM | POA: Diagnosis not present

## 2018-05-16 ENCOUNTER — Encounter: Payer: Self-pay | Admitting: Registered"

## 2018-05-16 ENCOUNTER — Encounter: Payer: BLUE CROSS/BLUE SHIELD | Attending: Family Medicine | Admitting: Registered"

## 2018-05-16 DIAGNOSIS — E1165 Type 2 diabetes mellitus with hyperglycemia: Secondary | ICD-10-CM | POA: Diagnosis not present

## 2018-05-16 DIAGNOSIS — Z713 Dietary counseling and surveillance: Secondary | ICD-10-CM | POA: Insufficient documentation

## 2018-05-16 DIAGNOSIS — I10 Essential (primary) hypertension: Secondary | ICD-10-CM | POA: Diagnosis not present

## 2018-05-16 DIAGNOSIS — K760 Fatty (change of) liver, not elsewhere classified: Secondary | ICD-10-CM | POA: Insufficient documentation

## 2018-05-16 DIAGNOSIS — E559 Vitamin D deficiency, unspecified: Secondary | ICD-10-CM | POA: Diagnosis not present

## 2018-05-16 DIAGNOSIS — E1129 Type 2 diabetes mellitus with other diabetic kidney complication: Secondary | ICD-10-CM | POA: Diagnosis not present

## 2018-05-16 NOTE — Patient Instructions (Addendum)
Consider checking your blood sugar 2x per day and use this information to help you make changes to bring down your numbers. Bring your log to your next appointment. Ask your doctor to change his recommendations on times per day for insurance coverage. Pay attention to your nighttime snacking to understand what is driving it. Continue to eat structured meals and pay attention to number of carbohydrates in your meal. Continue with structured walking Consider working on ways to get closer to 8 hrs sleep per night. Aim to reduce caffeine intake after lunchtime

## 2018-05-16 NOTE — Progress Notes (Signed)
Diabetes Self-Management Education  Visit Type: First/Initial  Appt. Start Time: 0800 Appt. End Time: 0737  05/16/2018  [name], identified by name and date of birth, is a 57 y.o. male with a diagnosis of Diabetes: Type 2.   This patient is accompanied in the office by his spouse.  ASSESSMENT Patient states he brought his wife to appointment because she does the cooking in the household. Pt states he was diagnoses in 1999 was was able to keep it pretty well controlled until the last few years with A1c just over 7% to 8.7% and now 9.4%. Pt reports Dr. Rex Kras referred him to [facility] to see if DSME will help to bring down A1c. After 3 months if there is not improvement in A1c, Pt states Dr. Rex Kras will refer to Dr. Loanne Drilling to T2DM management. Pt states his daughter had been recently diagnosed with T1DM and managing it well.  Patient's main goal is to bring down A1c, patient also had questions about fatty liver, appropriate body weight, exercise, DASH diet. RD will address the additional questions next visit as well recommending vitamin B12 due to long term metformin use (could switch out the vitamin C).  Patient's wife states she used to do Weight Watchers and some of her choices are from what she used doing the point system. Pt states he does not enjoy whole wheat, they use white wheat. Pt usually does not skips meals and does not eat snacks often during the day, but fees nighttime snacking is his weakness often while watching TV. Pt states 4x week they bring lunch to work. Pt states 1x month for celebrations at work his wife will bring in breakfast for the employees, last time bagels, fruit, low-fat cream cheese. Patient's wife states she aims to make balanced dinners and has cut back beef to 1-2x per week. Pt may benefit from more fish consumption or fish oil supplement, did not discuss this visit.  Physical activity: 3x/day. Walks dog in neighborhood with hills, moderate intensity.  Evening walk less intense. (RD will recommend adding resistance training at least 2x week) PA desn't seem to be bringing up HDL - fish oil may help some as well as bring down TG.  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  No    Are you taking your medications as prescribed?  Yes    Date Diagnosed  1999      Health Coping   How would you rate your overall health?  Fair      Psychosocial Assessment   Patient Belief/Attitude about Diabetes  Motivated to manage diabetes    How often do you need to have someone help you when you read instructions, pamphlets, or other written materials from your doctor or pharmacy?  1 - Never    What is the last grade level you completed in school?  BS Computer Science      Complications   Last HgB A1C per patient/outside source  9.4 %    How often do you check your blood sugar?  3-4  times / week    Number of hypoglycemic episodes per month  0    Have you had a dilated eye exam in the past 12 months?  Yes    Have you had a dental exam in the past 12 months?  No    Are you checking your feet?  Yes    How many days per week are you checking your feet?  2      Dietary Intake   Breakfast  eggs Engl muff OR cereal, banana    Snack (morning)  none    Lunch  white wheat (low carb from [employer]), sandwich, fruit, small bag chips, diet soda    Snack (afternoon)  none OR mini candy bars occassion    Dinner  meat (chicken, shellfish, beef) vegetables, corn or beans, potato, diet soda    Snack (evening)  peanuts, sliver of cake (birthday) OR popcorn OR pretzels, diet soda    Beverage(s)   coffee, diet soda (pepsi), water to avoid grazing      Exercise   Exercise Type  Light (walking / raking leaves)    How many days per week to you exercise?  7    How many minutes per day do you exercise?  30    Total minutes per week of exercise  210      Patient Education   Previous Diabetes Education  Yes (please comment) many years ago    Nutrition management   Role of diet in the treatment of diabetes and the relationship between the three main macronutrients and blood glucose level    Monitoring  Purpose and frequency of SMBG.    Chronic complications  Relationship between chronic complications and blood glucose control    Psychosocial adjustment  Role of stress on diabetes      Individualized Goals (developed by patient)   Nutrition  Other (comment) work on nighttime snacking    Physical Activity  Exercise 5-7 days per week    Monitoring   test my blood glucose as discussed      Outcomes   Expected Outcomes  Demonstrated interest in learning. Expect positive outcomes    Future DMSE  4-6 wks    Program Status  Not Completed     Individualized Plan for Diabetes Self-Management Training:   Learning Objective:  Patient will have a greater understanding of diabetes self-management. Patient education plan is to attend individual and/or group sessions per assessed needs and concerns.   Patient Instructions  Consider checking your blood sugar 2x per day and use this information to help you make changes to bring down your numbers. Bring your log to your next appointment. Ask your doctor to change his recommendations on times per day for insurance coverage. Pay attention to your nighttime snacking to understand what is driving it. Continue to eat structured meals and pay attention to number of carbohydrates in your meal. Continue with structured walking Consider working on ways to get closer to 8 hrs sleep per night. Aim to reduce caffeine intake after lunchtime   Expected  Outcomes:  Demonstrated interest in learning. Expect positive outcomes  Education material provided: ADA Diabetes: Your Take Control Guide, A1C conversion sheet and Carbohydrate counting sheet, Sleep Hygiene  If problems or questions, patient to contact team via:  Phone and Mychart  Future DSME appointment: 4-6 wks

## 2018-05-20 DIAGNOSIS — E559 Vitamin D deficiency, unspecified: Secondary | ICD-10-CM | POA: Diagnosis not present

## 2018-05-20 DIAGNOSIS — I10 Essential (primary) hypertension: Secondary | ICD-10-CM | POA: Diagnosis not present

## 2018-05-20 DIAGNOSIS — R809 Proteinuria, unspecified: Secondary | ICD-10-CM | POA: Diagnosis not present

## 2018-05-29 ENCOUNTER — Telehealth: Payer: Self-pay

## 2018-05-29 NOTE — Telephone Encounter (Signed)
Left a voice message concerning appointment has been placed back on [facility] schedule for 8/28 @ 3pm. Per 7/2 sch msg

## 2018-06-11 DIAGNOSIS — M5032 Other cervical disc degeneration, mid-cervical region, unspecified level: Secondary | ICD-10-CM | POA: Diagnosis not present

## 2018-06-11 DIAGNOSIS — M531 Cervicobrachial syndrome: Secondary | ICD-10-CM | POA: Diagnosis not present

## 2018-06-11 DIAGNOSIS — M5033 Other cervical disc degeneration, cervicothoracic region: Secondary | ICD-10-CM | POA: Diagnosis not present

## 2018-06-11 DIAGNOSIS — M9901 Segmental and somatic dysfunction of cervical region: Secondary | ICD-10-CM | POA: Diagnosis not present

## 2018-06-27 ENCOUNTER — Encounter: Payer: BLUE CROSS/BLUE SHIELD | Attending: Family Medicine | Admitting: Registered"

## 2018-06-27 DIAGNOSIS — K760 Fatty (change of) liver, not elsewhere classified: Secondary | ICD-10-CM | POA: Diagnosis not present

## 2018-06-27 DIAGNOSIS — Z713 Dietary counseling and surveillance: Secondary | ICD-10-CM | POA: Insufficient documentation

## 2018-06-27 DIAGNOSIS — E1165 Type 2 diabetes mellitus with hyperglycemia: Secondary | ICD-10-CM | POA: Insufficient documentation

## 2018-06-27 NOTE — Patient Instructions (Addendum)
Continue have 3 balanced meals per day and balanced snacks if hungry Remember to include protein with fruit Continue working on getting good sleep. Article on sleep: https://[email or web address]://[email or web address]/ You can try checking your blood sugar 2 hours after a meals Fish 2-3x week - salmon, tuna, trout. You can try supplementing with fish oil if you don't want to eat fish that often. Exercise will help fatty liver. Aim to have lean meat to help reduce saturated fat

## 2018-06-27 NOTE — Progress Notes (Signed)
Diabetes Self-Management Education  Visit Type: Follow-up  Appt. Start Time: 0835 Appt. End Time: 4403  06/27/2018  [name], identified by name and date of birth, is a 57 y.o. male with a diagnosis of Diabetes: Type 2.   ASSESSMENT Pt states he has been tracking food with My[employer]itness Pal and finding it helpful in making decisions about how much carbs to include with meals. Pt states he has been checking his FBG and often is 120s-130s sometimes 143, this morning was 107. Pt states one morning it was 180 and said he was up 11 pm the night before eating cashews and sending work emails. Overall RD believes he is probably staying close to target range for an A1c less than 7%.  Pt states he has not been checking PPBG, instead was checking before meals and the numbers have been under 130 mg/dL. Pt states this timing was to help him decide how much carbs he was going to have in a meal. Pt states recently he consumed a daily total of 100 g carbs according to his app and also confirms understanding of importance of carbs per meal/snack. Pt states his energy level was fine and feels better over all since making changes.   Pt states he is working on getting more sleep and going to bed earlier and drinking less caffeine before bedtime. Pt states sometimes he is getting 7 hrs sleep which he feels is "giagantic improvement" over the 5 or 6 hrs he was getting. Pt states he is keeping in mind the importance stress management.  Diabetes Self-Management Education - 06/27/18 0835      Visit Information   Visit Type  Follow-up      Initial Visit   Diabetes Type  Type 2      Dietary Intake   Breakfast  eggs, 1 toast, 2 bacon, [place] sausage    Snack (morning)  none    Lunch  [place] sandwich, sugar free jello, diet drink    Snack (afternoon)  plum    Dinner  Chicken teriyaki, 1/2 c brown rice, salad with ramen noodle oranges    Snack (evening)  cashews    Beverage(s)  coffee, water, diet drink       Patient Education   Nutrition management   Reviewed blood glucose goals for pre and post meals and how to evaluate the patients' food intake on their blood glucose level.    Physical activity and exercise   Role of exercise on diabetes management, blood pressure control and cardiac health.    Monitoring  Purpose and frequency of SMBG.      Individualized Goals (developed by patient)   Nutrition  General guidelines for healthy choices and portions discussed    Reducing Risk  Other (comment) work on getting more sleep      Outcomes   Expected Outcomes  Demonstrated interest in learning. Expect positive outcomes    Future DMSE  PRN    Program Status  Completed      Subsequent Visit   Since your last visit have you continued or begun to take your medications as prescribed?  Yes    Since your last visit have you had your blood pressure checked?  Yes    Is your most recent blood pressure lower, unchanged, or higher since your last visit?  Unchanged    Since your last visit have you experienced any weight changes?  Loss    Weight Loss (lbs)  2    Since your  last visit, are you checking your blood glucose at least once a day?  Yes      Individualized Plan for Diabetes Self-Management Training:   Learning Objective:  Patient will have a greater understanding of diabetes self-management. Patient education plan is to attend individual and/or group sessions per assessed needs and concerns.  Patient Instructions  Continue have 3 balanced meals per day and balanced snacks if hungry Remember to include protein with fruit Continue working on getting good sleep. Article on sleep: https://[email or web address]://[email or web address]/ You can try checking your blood sugar 2 hours after a meals Fish 2-3x week - salmon, tuna, trout. You can try supplementing with fish oil if you don't want to eat fish that often. Exercise will help fatty liver. Aim to have lean meat to help reduce  saturated fat   Expected Outcomes:  Demonstrated interest in learning. Expect positive outcomes  Education material provided: none  If problems or questions, patient to contact team via:  Phone and MyChart  Future DSME appointment: PRN

## 2018-07-09 DIAGNOSIS — M531 Cervicobrachial syndrome: Secondary | ICD-10-CM | POA: Diagnosis not present

## 2018-07-09 DIAGNOSIS — M5032 Other cervical disc degeneration, mid-cervical region, unspecified level: Secondary | ICD-10-CM | POA: Diagnosis not present

## 2018-07-09 DIAGNOSIS — M5033 Other cervical disc degeneration, cervicothoracic region: Secondary | ICD-10-CM | POA: Diagnosis not present

## 2018-07-09 DIAGNOSIS — M9901 Segmental and somatic dysfunction of cervical region: Secondary | ICD-10-CM | POA: Diagnosis not present

## 2018-07-10 DIAGNOSIS — D472 Monoclonal gammopathy: Secondary | ICD-10-CM | POA: Diagnosis not present

## 2018-07-12 DIAGNOSIS — B078 Other viral warts: Secondary | ICD-10-CM | POA: Diagnosis not present

## 2018-07-12 DIAGNOSIS — L821 Other seborrheic keratosis: Secondary | ICD-10-CM | POA: Diagnosis not present

## 2018-07-22 NOTE — Progress Notes (Signed)
[place]  [phone] Fax:([phone] [place] HEMATOLOGY ONCOLOGY PROGRESS NOTE  Date of Service:    07/23/18   Patient Care Team: Hulan Fess, MD as PCP - General (Family Medicine)  CHIEF COMPLAINTS/PURPOSE OF CONSULTATION:  MGUS   HISTORY OF PRESENTING ILLNESS:   Francis Silva 57 y.o. male with a history of hypertension and diabetes who was noted to have proteinuria and was referred to nephrology for further evaluation.  Patient was seen by Dr. Ila Mcgill at [facility] for further evaluation of his non-nephrotic range proteinuria that was initially thought to be due to the patient hx of DM or HTN.  Urine protein creatinine ratio was 407 mg/g of creatinine As a part of the workup for proteinuria the patient had an SPEP which showed a M spike of 0.4 g/dL. Immunofixation showed IgG monoclonal protein with A light chain specificity. IgG levels were normal at 1172. Normal IgA and IgM levels noted. Increase in serum free kappa light chains at 81.8 with a serum lambda free light chains at 10 with an abnormal ratio of 8.02.  Pt had a DG Bone Survey Met completed on 06/22/2017 with results revealing: Lucencies noted scattered throughout the skull. Myeloma and/or metastatic disease could present this fashion. No other significant bony lucencies are identified. 2. Calcifications are noted over the right neck in the region of the right submandibular gland. Submandibular gland tumor could present in this fashion.    Pt was referrred to our clinic clinic by Dr. Windy Kalata for further evaluation of the patient newly diagnosed monoclonal paraproteinemia.  Outside labs not show any anemia, elevated creatinine or hypercalcemia. He does not have any focal bone pains in his spine and ribs or skull.    INTERVAL HISTORY  Francis Silva returns today regarding his IgG kappa monoclonal paraproteinemia. The  patient's last visit with [place] was on 01/02/18. The pt reports that he is doing well overall.   The pt reports that he has had no new concerns in the interim. He denies any new bone pains or constitutional symptoms. He notes that he has remained active and has been abiding by a healthier diet, and has lost 12 pounds intentionally.  07/10/18 Kappa light chains at 115.3 with ratio at 11.5 07/10/18 M Protein at 0.4  On review of systems, pt reports good energy levels, intentional weight loss, staying active, eating well, and denies new bone pains, new fatigue, abdominal pains, pain along the spine, leg swelling, and any other symptoms.    MEDICAL HISTORY:  Past Medical History:  Diagnosis Date  . Diabetes ([place])   . HTN (hypertension)   . Hypokalemia     SURGICAL HISTORY: Past Surgical History:  Procedure Laterality Date  . COLONOSCOPY  2018    SOCIAL HISTORY: Social History   Socioeconomic History  . Marital status: Married    Spouse name: Not on file  . Number of children: Not on file  . Years of education: Not on file  . Highest education level: Not on file  Occupational History  . Not on file  Social Needs  . Financial resource strain: Not on file  . Food insecurity:    Worry: Not on file    Inability: Not on file  . Transportation needs:    Medical: Not on file    Non-medical: Not on file  Tobacco Use  . Smoking status: Never Smoker  . Smokeless tobacco:  Never Used  Substance and Sexual Activity  . Alcohol use: Yes    Comment: Social - weekly  . Drug use: No  . Sexual activity: Yes  Lifestyle  . Physical activity:    Days per week: Not on file    Minutes per session: Not on file  . Stress: Not on file  Relationships  . Social connections:    Talks on phone: Not on file    Gets together: Not on file    Attends religious service: Not on file    Active member of club or organization: Not on file    Attends meetings of clubs or organizations: Not on file     Relationship status: Not on file  . Intimate partner violence:    Fear of current or ex partner: Not on file    Emotionally abused: Not on file    Physically abused: Not on file    Forced sexual activity: Not on file  Other Topics Concern  . Not on file  Social History Narrative  . Not on file    FAMILY HISTORY: Family History  Problem Relation Age of Onset  . Thyroid disease Sister   . Diabetes Sister        Type II DM  . Heart attack Mother        Cause of death  . Heart disease Mother   . Heart disease Father   . Healthy Sister     ALLERGIES:  has No Known Allergies.  MEDICATIONS:  Current Outpatient Medications  Medication Sig Dispense Refill  . amLODipine (NORVASC) 10 MG tablet TK 1 T PO QD  1  . Ascorbic Acid (VITAMIN C) 1000 MG tablet Take 1,000 mg by mouth daily.    Marland Kitchen aspirin EC 81 MG tablet Take 81 mg by mouth daily.    . benazepril-hydrochlorthiazide (LOTENSIN HCT) 20-12.5 MG tablet TK 2 TS PO QD  3  . Cholecalciferol (VITAMIN D PO) Take 2,000 Units by mouth.     Marland Kitchen glimepiride (AMARYL) 4 MG tablet TK 1 T PO BID WC  1  . metFORMIN (GLUCOPHAGE-XR) 500 MG 24 hr tablet TK 4 TS PO QAM  5  . metoprolol succinate (TOPROL-XL) 100 MG 24 hr tablet TK 1 T PO BID  1  . potassium chloride (MICRO-K) 10 MEQ CR capsule TK 1 C PO QD WF  3  . WELCHOL 625 MG tablet TK 3 TS PO BID WC  3  . BAYER CONTOUR NEXT TEST test strip CHECK BLOOD SUGAR ONCE DAILY  0  . JANUVIA 100 MG tablet TK 1 T PO QD  3   No current facility-administered medications for this visit.     REVIEW OF SYSTEMS:    A 10+ POINT REVIEW OF SYSTEMS WAS OBTAINED including neurology, dermatology, psychiatry, cardiac, respiratory, lymph, extremities, GI, GU, Musculoskeletal, constitutional, breasts, reproductive, HEENT.  All pertinent positives are noted in the HPI.  All others are negative.   PHYSICAL EXAMINATION:  ECOG PERFORMANCE STATUS: 0 - Asymptomatic  Vitals:   07/23/18 1449  BP: (!) 151/87  Pulse: 71    Resp: 17  Temp: 98.4 F (36.9 C)  SpO2: 98%   Filed Weights   07/23/18 1449  Weight: 212 lb 4.8 oz (96.3 kg)    GENERAL:alert, in no acute distress and comfortable SKIN: no acute rashes, no significant lesions EYES: conjunctiva are pink and non-injected, sclera anicteric OROPHARYNX: MMM, no exudates, no oropharyngeal erythema or ulceration NECK: supple, no JVD LYMPH:  no palpable lymphadenopathy in the cervical, axillary or inguinal regions LUNGS: clear to auscultation b/l with normal respiratory effort HEART: regular rate & rhythm ABDOMEN:  normoactive bowel sounds , non tender, not distended. No palpable hepatosplenomegaly.  Extremity: no pedal edema PSYCH: alert & oriented x 3 with fluent speech NEURO: no focal motor/sensory deficits   LABORATORY DATA:  I have reviewed the data as listed  . CBC Latest Ref Rng & Units 01/02/2018 08/06/2017 11/02/2008  WBC 4.0 - 10.3 K/uL 7.3 7.9 7.0  Hemoglobin 13.0 - 17.1 g/dL 14.9 15.0 15.1  Hematocrit 38.4 - 49.9 % 41.9 42.8 43.4  Platelets 140 - 400 K/uL 188 180 195   . CMP Latest Ref Rng & Units 01/02/2018 08/06/2017 08/06/2017  Glucose 70 - 140 mg/dL 291(H) 262(H) -  BUN 7 - 26 mg/dL 15 13.1 -  Creatinine 0.70 - 1.30 mg/dL 1.20 1.1 -  Sodium 136 - 145 mmol/L 136 138 -  Potassium 3.5 - 5.1 mmol/L 3.5 3.6 -  Chloride 98 - 109 mmol/L 97(L) - -  CO2 22 - 29 mmol/L 26 30(H) -  Calcium 8.4 - 10.4 mg/dL 9.5 10.1 -  Total Protein 6.4 - 8.3 g/dL 7.4 8.0 7.4  Total Bilirubin 0.2 - 1.2 mg/dL 0.7 1.16 -  Alkaline Phos 40 - 150 U/L 72 63 -  AST 5 - 34 U/L 74(H) 48(H) -  ALT 0 - 55 U/L 143(H) 99(H) -   Component     Latest Ref Rng & Units 08/06/2017  Sed Rate     0 - 30 mm/hr 5  LDH     125 - 245 U/L 210  HIV Screen 4th Generation wRfx     Non Reactive Non Reactive  Hep C Virus Ab     0.0 - 0.9 s/co ratio <0.1     Component     Latest Ref Rng & Units 08/06/2017 09/10/2017 01/02/2018  IgG (Immunoglobin G), Serum     700 - 1600 mg/dL  1,015    IgA/Immunoglobulin A, Serum     90 - 386 mg/dL 122    IgM, Qn, Serum     20 - 172 mg/dL 36    Total Protein     6.0 - 8.5 g/dL 7.4    Albumin SerPl Elph-Mcnc     2.9 - 4.4 g/dL 4.2    Alpha 1     0.0 - 0.4 g/dL 0.2    Alpha2 Glob SerPl Elph-Mcnc     0.4 - 1.0 g/dL 0.9    B-Globulin SerPl Elph-Mcnc     0.7 - 1.3 g/dL 1.1    Gamma Glob SerPl Elph-Mcnc     0.4 - 1.8 g/dL 0.9    M Protein SerPl Elph-Mcnc     Not Observed g/dL 0.4 (H)    Globulin, Total     2.2 - 3.9 g/dL 3.2    Albumin/Glob SerPl     0.7 - 1.7 1.4    IFE 1      Comment    Please Note (HCV):      Comment    Protein Urine Random     Not Estab. mg/dL  51.8   Prot,24hr calculated     30 - 150 mg/24 hr  1,554 (H)   ALBUMIN, U     %  78.5   ALPHA 1 URINE     %  3.5   ALPHA-2-GLOBULIN, U     %  3.5   % BETA, Urine     %  10.8   GAMMA GLOBULIN URINE     %  3.7   M-SPIKE, %     Not Observed %  Not Observed   Immunofixation Result, Urine       Comment   NOTE:       Comment   Free Kappa Lt Chains,Ur     1.35 - 24.19 mg/L  32.80 (H)   Free Lambda Lt Chains,Ur     0.24 - 6.66 mg/L  2.96   Kappa/Lambda Ratio,U     2.04 - 10.37  11.08 (H)   Kappa free light chain     3.3 - 19.4 mg/L   91.7 (H)  Lamda free light chains     5.7 - 26.3 mg/L   10.0  Kappa, lamda light chain ratio     0.26 - 1.65   9.17 (H)  Beta-2 Microglobulin     0.6 - 2.4 mg/L   1.3  LDH     125 - 245 U/L   200    Multiple Myeloma Panel (SPEP&IFE w/QIG)      No reference range information available      Resulting Lab: [facility]      Comments:           Immunofixation shows IgG monoclonal protein with kappa light           chain           Specificity.  RADIOGRAPHIC STUDIES: I have personally reviewed the radiological images as listed and agreed with the findings in the report. No results found.    DG Bone Survey Met, 06/22/2017 IMPRESSION: 1. Lucencies noted scattered throughout the skull. Myeloma and/or metastatic  disease could present this fashion. No other significant bony lucencies are identified. 2. Calcifications are noted over the right neck in the region of the right submandibular gland. Submandibular gland tumor could present in this fashion. Nonenhanced and enhanced maxillofacial CT is suggested for further evaluation. 3. Bilateral carotid atherosclerotic vascular disease.  Whole body bone scan: 08/28/2018 IMPRESSION: 1. There is focal uptake in the right supra orbital rim which is nonspecific. It is unclear whether this correlates with 1 of the lucencies seen on a recent skeletal survey as only a lateral view was obtained at that time. Bone scan sensitivity is limited in the setting of purely lytic metastases or multiple myeloma. 2. Odontogenic change in the left maxilla. 3. Mild focal uptake over the right maxillary bone is nonspecific but may be odontogenic or secondary to underlying sinus disease.   Electronically Signed   By: Dorise Bullion III M.D   On: 08/28/2017 15:19  PET/CT -ordered - refused by insurance company  ASSESSMENT & PLAN:   57 y.o. male with   #1 IgG kappa monoclonal paraproteinemia Patient has no anemia and no overt renal failure and no hypercalcemia at this time. He has multiple indeterminate on a lucencies in the skull which  On bone scan do not show up as being active bone lesions. PET/CT was ordered and declined by his insurance company. His M spike was only 0.4 g/dL however the serum kappa lambda ratio is abnormal at 8. Cannot rule out a plasma cell dyscrasia.  #2  Non nephrotic Range proteinuria -clinically this appears to likely be related to diabetic nephropathy with additional comorbidities of hypertension. Unlikely to be related to plasma cell dyscrasia though with abnormalities in kappa/ lambda ratio cannot rule out possible of AL Amyloidosis. The decision for further working up his proteinuria with  a kidney biopsy would be a determination that  could be deferred to the nephrologist based on clinical suspicion given patient has other obvious explanations.  Plan:  -Discussed pt's proteinuria, but that the proteins are normal and likely a result of his DM.  -Discussed pt labwork from 07/10/18; chemistries are stable, no anemia with HGB at 14.5, blood counts are stable, Creatinine is normal, calcium is normal -Protein 24hr urine has decreased over the last 10 months, at 1.554 grams in October 2018, to 881m in August 2019 -Continue follow up with Dr. PPosey Prontoin Nephrology and Dr. LRex Krasas PCP -Will see the pt back in one year, sooner if any new concerns    RTC with Dr KIrene Limboin 12 months  All questions were answered. The patient knows to call the clinic with any problems, questions or concerns.  The total time spent in the appt was 30 minutes and more than 50% was on counseling and direct patient cares.    GSullivan LoneMD MS AAHIVMS SPhilhavenCDanbury Surgical Center LPHematology/Oncology Physician CCle[facility] (Office):       [phone](Work cell):  [phone](Fax):           [phone] I, SBaldwin [place] am acting as a scribe for Dr. KIrene Limbo .I have reviewed the above documentation for accuracy and completeness, and I agree with the above. .Brunetta GeneraMD

## 2018-07-23 ENCOUNTER — Encounter: Payer: Self-pay | Admitting: Hematology

## 2018-07-23 ENCOUNTER — Inpatient Hospital Stay: Payer: BLUE CROSS/BLUE SHIELD | Attending: Hematology | Admitting: Hematology

## 2018-07-23 ENCOUNTER — Telehealth: Payer: Self-pay | Admitting: Hematology

## 2018-07-23 ENCOUNTER — Ambulatory Visit: Payer: BLUE CROSS/BLUE SHIELD | Admitting: Hematology

## 2018-07-23 ENCOUNTER — Other Ambulatory Visit: Payer: BLUE CROSS/BLUE SHIELD

## 2018-07-23 VITALS — BP 151/87 | HR 71 | Temp 98.4°F | Resp 17 | Ht 69.0 in | Wt 212.3 lb

## 2018-07-23 DIAGNOSIS — E1140 Type 2 diabetes mellitus with diabetic neuropathy, unspecified: Secondary | ICD-10-CM | POA: Diagnosis not present

## 2018-07-23 DIAGNOSIS — R809 Proteinuria, unspecified: Secondary | ICD-10-CM | POA: Diagnosis not present

## 2018-07-23 DIAGNOSIS — I10 Essential (primary) hypertension: Secondary | ICD-10-CM | POA: Diagnosis not present

## 2018-07-23 DIAGNOSIS — D472 Monoclonal gammopathy: Secondary | ICD-10-CM | POA: Diagnosis not present

## 2018-07-23 DIAGNOSIS — M899 Disorder of bone, unspecified: Secondary | ICD-10-CM | POA: Diagnosis not present

## 2018-07-23 NOTE — Telephone Encounter (Signed)
Pt declined avs and calendar  °

## 2018-08-13 DIAGNOSIS — M5033 Other cervical disc degeneration, cervicothoracic region: Secondary | ICD-10-CM | POA: Diagnosis not present

## 2018-08-13 DIAGNOSIS — M5032 Other cervical disc degeneration, mid-cervical region, unspecified level: Secondary | ICD-10-CM | POA: Diagnosis not present

## 2018-08-13 DIAGNOSIS — M531 Cervicobrachial syndrome: Secondary | ICD-10-CM | POA: Diagnosis not present

## 2018-08-13 DIAGNOSIS — M9901 Segmental and somatic dysfunction of cervical region: Secondary | ICD-10-CM | POA: Diagnosis not present

## 2018-09-12 DIAGNOSIS — M531 Cervicobrachial syndrome: Secondary | ICD-10-CM | POA: Diagnosis not present

## 2018-09-12 DIAGNOSIS — M5033 Other cervical disc degeneration, cervicothoracic region: Secondary | ICD-10-CM | POA: Diagnosis not present

## 2018-09-12 DIAGNOSIS — M5032 Other cervical disc degeneration, mid-cervical region, unspecified level: Secondary | ICD-10-CM | POA: Diagnosis not present

## 2018-09-12 DIAGNOSIS — M9901 Segmental and somatic dysfunction of cervical region: Secondary | ICD-10-CM | POA: Diagnosis not present

## 2018-10-07 DIAGNOSIS — M531 Cervicobrachial syndrome: Secondary | ICD-10-CM | POA: Diagnosis not present

## 2018-10-07 DIAGNOSIS — M5033 Other cervical disc degeneration, cervicothoracic region: Secondary | ICD-10-CM | POA: Diagnosis not present

## 2018-10-07 DIAGNOSIS — M5032 Other cervical disc degeneration, mid-cervical region, unspecified level: Secondary | ICD-10-CM | POA: Diagnosis not present

## 2018-10-07 DIAGNOSIS — M9901 Segmental and somatic dysfunction of cervical region: Secondary | ICD-10-CM | POA: Diagnosis not present

## 2018-11-04 DIAGNOSIS — M5032 Other cervical disc degeneration, mid-cervical region, unspecified level: Secondary | ICD-10-CM | POA: Diagnosis not present

## 2018-11-04 DIAGNOSIS — M5033 Other cervical disc degeneration, cervicothoracic region: Secondary | ICD-10-CM | POA: Diagnosis not present

## 2018-11-04 DIAGNOSIS — M9901 Segmental and somatic dysfunction of cervical region: Secondary | ICD-10-CM | POA: Diagnosis not present

## 2018-11-04 DIAGNOSIS — M531 Cervicobrachial syndrome: Secondary | ICD-10-CM | POA: Diagnosis not present

## 2018-12-02 DIAGNOSIS — M531 Cervicobrachial syndrome: Secondary | ICD-10-CM | POA: Diagnosis not present

## 2018-12-02 DIAGNOSIS — M5032 Other cervical disc degeneration, mid-cervical region, unspecified level: Secondary | ICD-10-CM | POA: Diagnosis not present

## 2018-12-02 DIAGNOSIS — M5033 Other cervical disc degeneration, cervicothoracic region: Secondary | ICD-10-CM | POA: Diagnosis not present

## 2018-12-02 DIAGNOSIS — M9901 Segmental and somatic dysfunction of cervical region: Secondary | ICD-10-CM | POA: Diagnosis not present

## 2018-12-30 DIAGNOSIS — M9901 Segmental and somatic dysfunction of cervical region: Secondary | ICD-10-CM | POA: Diagnosis not present

## 2018-12-30 DIAGNOSIS — M531 Cervicobrachial syndrome: Secondary | ICD-10-CM | POA: Diagnosis not present

## 2018-12-30 DIAGNOSIS — M5032 Other cervical disc degeneration, mid-cervical region, unspecified level: Secondary | ICD-10-CM | POA: Diagnosis not present

## 2018-12-30 DIAGNOSIS — M5033 Other cervical disc degeneration, cervicothoracic region: Secondary | ICD-10-CM | POA: Diagnosis not present

## 2019-01-27 DIAGNOSIS — M5032 Other cervical disc degeneration, mid-cervical region, unspecified level: Secondary | ICD-10-CM | POA: Diagnosis not present

## 2019-01-27 DIAGNOSIS — M531 Cervicobrachial syndrome: Secondary | ICD-10-CM | POA: Diagnosis not present

## 2019-01-27 DIAGNOSIS — M5033 Other cervical disc degeneration, cervicothoracic region: Secondary | ICD-10-CM | POA: Diagnosis not present

## 2019-01-27 DIAGNOSIS — M9901 Segmental and somatic dysfunction of cervical region: Secondary | ICD-10-CM | POA: Diagnosis not present

## 2019-02-06 DIAGNOSIS — R809 Proteinuria, unspecified: Secondary | ICD-10-CM | POA: Diagnosis not present

## 2019-02-06 DIAGNOSIS — E1129 Type 2 diabetes mellitus with other diabetic kidney complication: Secondary | ICD-10-CM | POA: Diagnosis not present

## 2019-02-06 DIAGNOSIS — E559 Vitamin D deficiency, unspecified: Secondary | ICD-10-CM | POA: Diagnosis not present

## 2019-02-12 DIAGNOSIS — E559 Vitamin D deficiency, unspecified: Secondary | ICD-10-CM | POA: Diagnosis not present

## 2019-02-12 DIAGNOSIS — I10 Essential (primary) hypertension: Secondary | ICD-10-CM | POA: Diagnosis not present

## 2019-02-12 DIAGNOSIS — D472 Monoclonal gammopathy: Secondary | ICD-10-CM | POA: Diagnosis not present

## 2019-02-12 DIAGNOSIS — R809 Proteinuria, unspecified: Secondary | ICD-10-CM | POA: Diagnosis not present

## 2019-02-24 DIAGNOSIS — M9901 Segmental and somatic dysfunction of cervical region: Secondary | ICD-10-CM | POA: Diagnosis not present

## 2019-02-24 DIAGNOSIS — M5032 Other cervical disc degeneration, mid-cervical region, unspecified level: Secondary | ICD-10-CM | POA: Diagnosis not present

## 2019-02-24 DIAGNOSIS — M531 Cervicobrachial syndrome: Secondary | ICD-10-CM | POA: Diagnosis not present

## 2019-02-24 DIAGNOSIS — M5033 Other cervical disc degeneration, cervicothoracic region: Secondary | ICD-10-CM | POA: Diagnosis not present

## 2019-03-24 DIAGNOSIS — M5032 Other cervical disc degeneration, mid-cervical region, unspecified level: Secondary | ICD-10-CM | POA: Diagnosis not present

## 2019-03-24 DIAGNOSIS — M531 Cervicobrachial syndrome: Secondary | ICD-10-CM | POA: Diagnosis not present

## 2019-03-24 DIAGNOSIS — M5033 Other cervical disc degeneration, cervicothoracic region: Secondary | ICD-10-CM | POA: Diagnosis not present

## 2019-03-24 DIAGNOSIS — M9901 Segmental and somatic dysfunction of cervical region: Secondary | ICD-10-CM | POA: Diagnosis not present

## 2019-04-22 DIAGNOSIS — M531 Cervicobrachial syndrome: Secondary | ICD-10-CM | POA: Diagnosis not present

## 2019-04-22 DIAGNOSIS — M9901 Segmental and somatic dysfunction of cervical region: Secondary | ICD-10-CM | POA: Diagnosis not present

## 2019-04-22 DIAGNOSIS — M5032 Other cervical disc degeneration, mid-cervical region, unspecified level: Secondary | ICD-10-CM | POA: Diagnosis not present

## 2019-04-22 DIAGNOSIS — M5033 Other cervical disc degeneration, cervicothoracic region: Secondary | ICD-10-CM | POA: Diagnosis not present

## 2019-04-23 DIAGNOSIS — E041 Nontoxic single thyroid nodule: Secondary | ICD-10-CM | POA: Diagnosis not present

## 2019-04-23 DIAGNOSIS — R809 Proteinuria, unspecified: Secondary | ICD-10-CM | POA: Diagnosis not present

## 2019-04-23 DIAGNOSIS — Z125 Encounter for screening for malignant neoplasm of prostate: Secondary | ICD-10-CM | POA: Diagnosis not present

## 2019-04-23 DIAGNOSIS — E1129 Type 2 diabetes mellitus with other diabetic kidney complication: Secondary | ICD-10-CM | POA: Diagnosis not present

## 2019-04-23 DIAGNOSIS — D472 Monoclonal gammopathy: Secondary | ICD-10-CM | POA: Diagnosis not present

## 2019-04-23 DIAGNOSIS — I10 Essential (primary) hypertension: Secondary | ICD-10-CM | POA: Diagnosis not present

## 2019-05-19 DIAGNOSIS — M531 Cervicobrachial syndrome: Secondary | ICD-10-CM | POA: Diagnosis not present

## 2019-05-19 DIAGNOSIS — M5032 Other cervical disc degeneration, mid-cervical region, unspecified level: Secondary | ICD-10-CM | POA: Diagnosis not present

## 2019-05-19 DIAGNOSIS — M5033 Other cervical disc degeneration, cervicothoracic region: Secondary | ICD-10-CM | POA: Diagnosis not present

## 2019-05-19 DIAGNOSIS — M9901 Segmental and somatic dysfunction of cervical region: Secondary | ICD-10-CM | POA: Diagnosis not present

## 2019-05-26 DIAGNOSIS — K760 Fatty (change of) liver, not elsewhere classified: Secondary | ICD-10-CM | POA: Diagnosis not present

## 2019-06-16 DIAGNOSIS — M531 Cervicobrachial syndrome: Secondary | ICD-10-CM | POA: Diagnosis not present

## 2019-06-16 DIAGNOSIS — M5032 Other cervical disc degeneration, mid-cervical region, unspecified level: Secondary | ICD-10-CM | POA: Diagnosis not present

## 2019-06-16 DIAGNOSIS — M5033 Other cervical disc degeneration, cervicothoracic region: Secondary | ICD-10-CM | POA: Diagnosis not present

## 2019-06-16 DIAGNOSIS — M9901 Segmental and somatic dysfunction of cervical region: Secondary | ICD-10-CM | POA: Diagnosis not present

## 2019-07-14 DIAGNOSIS — M531 Cervicobrachial syndrome: Secondary | ICD-10-CM | POA: Diagnosis not present

## 2019-07-14 DIAGNOSIS — M9901 Segmental and somatic dysfunction of cervical region: Secondary | ICD-10-CM | POA: Diagnosis not present

## 2019-07-14 DIAGNOSIS — M5032 Other cervical disc degeneration, mid-cervical region, unspecified level: Secondary | ICD-10-CM | POA: Diagnosis not present

## 2019-07-14 DIAGNOSIS — M5033 Other cervical disc degeneration, cervicothoracic region: Secondary | ICD-10-CM | POA: Diagnosis not present

## 2019-07-15 DIAGNOSIS — D472 Monoclonal gammopathy: Secondary | ICD-10-CM | POA: Diagnosis not present

## 2019-07-17 ENCOUNTER — Encounter: Payer: Self-pay | Admitting: Hematology

## 2019-07-18 ENCOUNTER — Telehealth: Payer: Self-pay | Admitting: Hematology

## 2019-07-18 NOTE — Telephone Encounter (Signed)
Called patient regarding upcoming Webex appointment, patient is notified and e-mail has been sent. °

## 2019-07-21 ENCOUNTER — Telehealth: Payer: Self-pay | Admitting: Hematology

## 2019-07-21 NOTE — Telephone Encounter (Signed)
Confirmed webex appt and verified info

## 2019-07-22 ENCOUNTER — Inpatient Hospital Stay: Payer: BC Managed Care – PPO | Attending: Hematology | Admitting: Hematology

## 2019-07-22 DIAGNOSIS — D472 Monoclonal gammopathy: Secondary | ICD-10-CM

## 2019-07-22 NOTE — Progress Notes (Signed)
[place]  [phone] Fax:([phone]) [place] [facility]AT[facility] ONCOLOGY PROGRESS NOTE  Date of Service:    07/22/19   Patient Care Team: Francis Fess, MD as PCP - General (Family Medicine)  CHIEF COMPLAINTS/PURPOSE OF CONSULTATION:  MGUS   HISTORY OF PRESENTING ILLNESS:   Francis Silva 58 y.o. male with a history of hypertension and diabetes who was noted to have proteinuria and was referred to nephrology for further evaluation.  Patient was seen by Dr. Ila Silva at [facility] for further evaluation of his non-nephrotic range proteinuria that was initially thought to be due to the patient hx of DM or HTN.  Urine protein creatinine ratio was 407 mg/g of creatinine As a part of the workup for proteinuria the patient had an SPEP which showed a M spike of 0.4 g/dL. Immunofixation showed IgG monoclonal protein with A light chain specificity. IgG levels were normal at 1172. Normal IgA and IgM levels noted. Increase in serum free kappa light chains at 81.8 with a serum lambda free light chains at 10 with an abnormal ratio of 8.02.  Pt had a DG Bone Survey Met completed on 06/22/2017 with results revealing: Lucencies noted scattered throughout the skull. Myeloma and/or metastatic disease could present this fashion. No other significant bony lucencies are identified. 2. Calcifications are noted over the right neck in the region of the right submandibular gland. Submandibular gland tumor could present in this fashion.    Pt was referrred to our clinic clinic by Dr. Windy Silva for further evaluation of the patient newly diagnosed monoclonal paraproteinemia.  Outside labs not show any anemia, elevated creatinine or hypercalcemia. He does not have any focal bone pains in his spine and ribs or skull.    INTERVAL HISTORY  Francis Silva returns today regarding his IgG kappa monoclonal paraproteinemia. The  patient's last visit with [place] was on 07/23/2018. The pt reports that he is doing well overall.  I connected with  Francis Silva on 07/22/19 by phone and verified that I am speaking with the correct person using two identifiers.   I discussed the limitations of evaluation and management by telemedicine. The patient expressed understanding and agreed to proceed.  The pt reports no new concerns within the last year.   Lab results (07/15/19) of CBC w/diff and CMP is as follows: WBC at 11.2K, RBC at 5.36, Hgb at 16.3, HCT at 47.2, MCV at 88, MCH at 30.4, RDW at 13.8, Platelets at 200K, Neutrophils at 79, Lymphs at 10, Monocytes at 9, Neutrophils Abs at 8.7K, Lymphs Abs at 1.1K, Monocytes Abs at 1.1K, Glucose at 205, BUN at 18, Creatinine at 1.03, GFR Est Non Af Am at 81, BUN/Creatinine ratio at 17, Total Protein at 7.4, Albumin at 5.0, Total Globulin at 2.4, Alkaline Phosphatase at 64. 07/15/2019 SPEP is as follows: Total protein at 7.3, Albumin at 4.0, Alpha-1-Globulin at 0.2, Alpha-2-Globulin at 0.9, Beta Globulin at 1.2, Gamma Globulin at 1.0, M-Spike at 0.4, Total Globulin at 3.3, A/G ratio at 1.2.  07/15/2019 UPEP is as follows: Total protein(urine) at 38.1, U Albumin at 79.6, U Alpha-1-Globulin at 2.2, U Alpha-2-Globulin 3.6, U Beta Globulin at 9.4, U Gamma Globulin at 5.3.  07/15/2019 Free Light Chains Serum is as follows: Free Kappa Lt chains at 129.6, Free Lamda Lt Chains is 10.7, Kappa/Lamda Ratio is 12.11. 07/15/2019 Immunofixation Reflex Serum is as follows: Immunoglobulin G, Qn at 995, Immunoglobulin A,  Qn at 103, Immunoglobulin M, Qn at 56.   On review of systems, pt denies any new bone pains and any other symptoms.   MEDICAL HISTORY:  Past Medical History:  Diagnosis Date  . Diabetes (Real)   . HTN (hypertension)   . Hypokalemia     SURGICAL HISTORY: Past Surgical History:  Procedure Laterality Date  . COLONOSCOPY  2018    SOCIAL HISTORY: Social History   Socioeconomic  History  . Marital status: Married    Spouse name: Not on file  . Number of children: Not on file  . Years of education: Not on file  . Highest education level: Not on file  Occupational History  . Not on file  Social Needs  . Financial resource strain: Not on file  . Food insecurity    Worry: Not on file    Inability: Not on file  . Transportation needs    Medical: Not on file    Non-medical: Not on file  Tobacco Use  . Smoking status: Never Smoker  . Smokeless tobacco: Never Used  Substance and Sexual Activity  . Alcohol use: Yes    Comment: Social - weekly  . Drug use: No  . Sexual activity: Yes  Lifestyle  . Physical activity    Days per week: Not on file    Minutes per session: Not on file  . Stress: Not on file  Relationships  . Social Herbalist on phone: Not on file    Gets together: Not on file    Attends religious service: Not on file    Active member of club or organization: Not on file    Attends meetings of clubs or organizations: Not on file    Relationship status: Not on file  . Intimate partner violence    Fear of current or ex partner: Not on file    Emotionally abused: Not on file    Physically abused: Not on file    Forced sexual activity: Not on file  Other Topics Concern  . Not on file  Social History Narrative  . Not on file    FAMILY HISTORY: Family History  Problem Relation Age of Onset  . Thyroid disease Sister   . Diabetes Sister        Type II DM  . Heart attack Mother        Cause of death  . Heart disease Mother   . Heart disease Father   . Healthy Sister     ALLERGIES:  has No Known Allergies.  MEDICATIONS:  Current Outpatient Medications  Medication Sig Dispense Refill  . amLODipine (NORVASC) 10 MG tablet TK 1 T PO QD  1  . Ascorbic Acid (VITAMIN C) 1000 MG tablet Take 1,000 mg by mouth daily.    Marland Kitchen aspirin EC 81 MG tablet Take 81 mg by mouth daily.    Marland Kitchen BAYER CONTOUR NEXT TEST test strip CHECK BLOOD SUGAR  ONCE DAILY  0  . benazepril-hydrochlorthiazide (LOTENSIN HCT) 20-12.5 MG tablet TK 2 TS PO QD  3  . Cholecalciferol (VITAMIN D PO) Take 2,000 Units by mouth.     Marland Kitchen glimepiride (AMARYL) 4 MG tablet TK 1 T PO BID WC  1  . JANUVIA 100 MG tablet TK 1 T PO QD  3  . metFORMIN (GLUCOPHAGE-XR) 500 MG 24 hr tablet TK 4 TS PO QAM  5  . metoprolol succinate (TOPROL-XL) 100 MG 24 hr tablet TK 1 T PO  BID  1  . potassium chloride (MICRO-K) 10 MEQ CR capsule TK 1 C PO QD WF  3  . WELCHOL 625 MG tablet TK 3 TS PO BID WC  3   No current facility-administered medications for this visit.     REVIEW OF SYSTEMS:    A 10+ POINT REVIEW OF SYSTEMS WAS OBTAINED including neurology, dermatology, psychiatry, cardiac, respiratory, lymph, extremities, GI, GU, Musculoskeletal, constitutional, breasts, reproductive, HEENT.  All pertinent positives are noted in the HPI.  All others are negative.   PHYSICAL EXAMINATION:  ECOG PERFORMANCE STATUS: 0 - Asymptomatic  There were no vitals filed for this visit. There were no vitals filed for this visit.  Telehealth visit  LABORATORY DATA:  I have reviewed the data as listed  . CBC Latest Ref Rng & Units 01/02/2018 08/06/2017 11/02/2008  WBC 4.0 - 10.3 K/uL 7.3 7.9 7.0  Hemoglobin 13.0 - 17.1 g/dL 14.9 15.0 15.1  Hematocrit 38.4 - 49.9 % 41.9 42.8 43.4  Platelets 140 - 400 K/uL 188 180 195   . CMP Latest Ref Rng & Units 01/02/2018 08/06/2017 08/06/2017  Glucose 70 - 140 mg/dL 291(H) 262(H) -  BUN 7 - 26 mg/dL 15 13.1 -  Creatinine 0.70 - 1.30 mg/dL 1.20 1.1 -  Sodium 136 - 145 mmol/L 136 138 -  Potassium 3.5 - 5.1 mmol/L 3.5 3.6 -  Chloride 98 - 109 mmol/L 97(L) - -  CO2 22 - 29 mmol/L 26 30(H) -  Calcium 8.4 - 10.4 mg/dL 9.5 10.1 -  Total Protein 6.4 - 8.3 g/dL 7.4 8.0 7.4  Total Bilirubin 0.2 - 1.2 mg/dL 0.7 1.16 -  Alkaline Phos 40 - 150 U/L 72 63 -  AST 5 - 34 U/L 74(H) 48(H) -  ALT 0 - 55 U/L 143(H) 99(H) -   Component     Latest Ref Rng & Units 08/06/2017   Sed Rate     0 - 30 mm/hr 5  LDH     125 - 245 U/L 210  HIV Screen 4th Generation wRfx     Non Reactive Non Reactive  Hep C Virus Ab     0.0 - 0.9 s/co ratio <0.1     Component     Latest Ref Rng & Units 08/06/2017 09/10/2017 01/02/2018  IgG (Immunoglobin G), Serum     700 - 1600 mg/dL 1,015    IgA/Immunoglobulin A, Serum     90 - 386 mg/dL 122    IgM, Qn, Serum     20 - 172 mg/dL 36    Total Protein     6.0 - 8.5 g/dL 7.4    Albumin SerPl Elph-Mcnc     2.9 - 4.4 g/dL 4.2    Alpha 1     0.0 - 0.4 g/dL 0.2    Alpha2 Glob SerPl Elph-Mcnc     0.4 - 1.0 g/dL 0.9    B-Globulin SerPl Elph-Mcnc     0.7 - 1.3 g/dL 1.1    Gamma Glob SerPl Elph-Mcnc     0.4 - 1.8 g/dL 0.9    M Protein SerPl Elph-Mcnc     Not Observed g/dL 0.4 (H)    Globulin, Total     2.2 - 3.9 g/dL 3.2    Albumin/Glob SerPl     0.7 - 1.7 1.4    IFE 1      Comment    Please Note (HCV):      Comment    Protein Urine Random  Not Estab. mg/dL  51.8   Prot,24hr calculated     30 - 150 mg/24 hr  1,554 (H)   ALBUMIN, U     %  78.5   ALPHA 1 URINE     %  3.5   ALPHA-2-GLOBULIN, U     %  3.5   % BETA, Urine     %  10.8   GAMMA GLOBULIN URINE     %  3.7   M-SPIKE, %     Not Observed %  Not Observed   Immunofixation Result, Urine       Comment   NOTE:       Comment   Free Kappa Lt Chains,Ur     1.35 - 24.19 mg/L  32.80 (H)   Free Lambda Lt Chains,Ur     0.24 - 6.66 mg/L  2.96   Kappa/Lambda Ratio,U     2.04 - 10.37  11.08 (H)   Kappa free light chain     3.3 - 19.4 mg/L   91.7 (H)  Lamda free light chains     5.7 - 26.3 mg/L   10.0  Kappa, lamda light chain ratio     0.26 - 1.65   9.17 (H)  Beta-2 Microglobulin     0.6 - 2.4 mg/L   1.3  LDH     125 - 245 U/L   200    Multiple Myeloma Panel (SPEP&IFE w/QIG)      No reference range information available      Resulting Lab: [facility]      Comments:           Immunofixation shows IgG monoclonal protein with kappa light           chain            Specificity.  RADIOGRAPHIC STUDIES: I have personally reviewed the radiological images as listed and agreed with the findings in the report. No results found.    DG Bone Survey Met, 06/22/2017 IMPRESSION: 1. Lucencies noted scattered throughout the skull. Myeloma and/or metastatic disease could present this fashion. No other significant bony lucencies are identified. 2. Calcifications are noted over the right neck in the region of the right submandibular gland. Submandibular gland tumor could present in this fashion. Nonenhanced and enhanced maxillofacial CT is suggested for further evaluation. 3. Bilateral carotid atherosclerotic vascular disease.  Whole body bone scan: 08/28/2018 IMPRESSION: 1. There is focal uptake in the right supra orbital rim which is nonspecific. It is unclear whether this correlates with 1 of the lucencies seen on a recent skeletal survey as only a lateral view was obtained at that time. Bone scan sensitivity is limited in the setting of purely lytic metastases or multiple myeloma. 2. Odontogenic change in the left maxilla. 3. Mild focal uptake over the right maxillary bone is nonspecific but may be odontogenic or secondary to underlying sinus disease.   Electronically Signed   By: Dorise Bullion III M.D   On: 08/28/2017 15:19  PET/CT -ordered - refused by insurance company  ASSESSMENT & PLAN:   58 y.o. male with   #1 IgG kappa monoclonal paraproteinemia Patient has no anemia and no overt renal failure and no hypercalcemia at this time. He has multiple indeterminate on a lucencies in the skull which  On bone scan do not show up as being active bone lesions. PET/CT was ordered and declined by his insurance company. His M spike was only 0.4 g/dL however the serum kappa  lambda ratio is abnormal at 8. Cannot rule out a plasma cell dyscrasia.  #2  Non nephrotic Range proteinuria -clinically this appears to likely be related to diabetic  nephropathy with additional comorbidities of hypertension. Unlikely to be related to plasma cell dyscrasia though with abnormalities in kappa/ lambda ratio cannot rule out possible of AL Amyloidosis. The decision for further working up his proteinuria with a kidney biopsy would be a determination that could be deferred to the nephrologist based on clinical suspicion given patient has other obvious explanations. -Protein 24hr urine has decreased over the last 10 months, at 1.554 grams in October 2018, to 810m in August 2019  Plan:   -Discussed pt labwork 07/15/19; WBC at 11.2K, RBC at 5.36, Hgb at 16.3, HCT at 47.2, MCV at 88, MCH at 30.4, RDW at 13.8, Platelets at 200K, Neutrophils at 79, Lymphs at 10, Monocytes at 9, Neutrophils Abs at 8.7K, Lymphs Abs at 1.1K, Monocytes Abs at 1.1K, Glucose at 205, BUN at 18, Creatinine at 1.03, GFR Est Non Af Am at 81, BUN/Creatinine ratio at 17, Total Protein at 7.4, Albumin at 5.0, Total Globulin at 2.4, Alkaline Phosphatase at 64. -Discussed 07/15/2019 SPEP is as follows: Total protein at 7.3, Albumin at 4.0, Alpha-1-Globulin at 0.2, Alpha-2-Globulin at 0.9, Beta Globulin at 1.2, Gamma Globulin at 1.0, M-Spike at 0.4, Total Globulin at 3.3, A/G ratio at 1.2.  -Discussed 07/15/2019 UPEP is as follows: Total protein(urine) at 38.1, U Albumin at 79.6, U Alpha-1-Globulin at 2.2, U Alpha-2-Globulin 3.6, U Beta Globulin at 9.4, U Gamma Globulin at 5.3.  -Discussed 07/15/2019 Free Light Chains Serum is as follows: Free Kappa Lt chains at 129.6, Free Lamda Lt Chains is 10.7, Kappa/Lamda Ratio is 12.11. -Discussed 07/15/2019 Immunofixation Reflex Serum is as follows: Immunoglobulin G, Qn at 995, Immunoglobulin A, Qn at 103, Immunoglobulin M, Qn at 56.  - no evidence of clinical or lab progression to myeloma/. -Will need to monitor with labs(SPEP) in once a year -Pt prefers to follow up on his labs with Dr. PPosey Prontoin Nephrology and Dr. LRex Krasas PCP  FOLLOW UP: RTC with  Dr KIrene Limboas needed. Patient prefers to f/u with labs with PCP and nephrology   The total time spent in the appt was 15 minutes and more than 50% was on counseling and direct patient cares.  All of the patient's questions were answered with apparent satisfaction. The patient knows to call the clinic with any problems, questions or concerns.    GSullivan LoneMD MFort ValleyAAHIVMS SUpmc JamesonCSouthwest Washington Regional Surgery Center LLCHematology/Oncology Physician CBaptist [facility] (Office):       [phone](Work cell):  [phone](Fax):           [phone] I, JYevette Edwards am acting as a scribe for Dr. GSullivan Lone   .I have reviewed the above documentation for accuracy and completeness, and I agree with the above. .Brunetta GeneraMD

## 2019-07-23 ENCOUNTER — Telehealth: Payer: Self-pay | Admitting: Hematology

## 2019-07-23 NOTE — Telephone Encounter (Signed)
Per 8/25 los RTC with Dr Irene Limbo as needed. Patient prefers to f/u with labs with PCP and nephrology

## 2019-08-12 DIAGNOSIS — M5033 Other cervical disc degeneration, cervicothoracic region: Secondary | ICD-10-CM | POA: Diagnosis not present

## 2019-08-12 DIAGNOSIS — M5032 Other cervical disc degeneration, mid-cervical region, unspecified level: Secondary | ICD-10-CM | POA: Diagnosis not present

## 2019-08-12 DIAGNOSIS — M9901 Segmental and somatic dysfunction of cervical region: Secondary | ICD-10-CM | POA: Diagnosis not present

## 2019-08-12 DIAGNOSIS — M531 Cervicobrachial syndrome: Secondary | ICD-10-CM | POA: Diagnosis not present

## 2019-09-09 DIAGNOSIS — M5033 Other cervical disc degeneration, cervicothoracic region: Secondary | ICD-10-CM | POA: Diagnosis not present

## 2019-09-09 DIAGNOSIS — M531 Cervicobrachial syndrome: Secondary | ICD-10-CM | POA: Diagnosis not present

## 2019-09-09 DIAGNOSIS — M9901 Segmental and somatic dysfunction of cervical region: Secondary | ICD-10-CM | POA: Diagnosis not present

## 2019-09-09 DIAGNOSIS — M5032 Other cervical disc degeneration, mid-cervical region, unspecified level: Secondary | ICD-10-CM | POA: Diagnosis not present

## 2019-09-26 DIAGNOSIS — E119 Type 2 diabetes mellitus without complications: Secondary | ICD-10-CM | POA: Diagnosis not present

## 2019-10-07 DIAGNOSIS — M531 Cervicobrachial syndrome: Secondary | ICD-10-CM | POA: Diagnosis not present

## 2019-10-07 DIAGNOSIS — M5033 Other cervical disc degeneration, cervicothoracic region: Secondary | ICD-10-CM | POA: Diagnosis not present

## 2019-10-07 DIAGNOSIS — M5032 Other cervical disc degeneration, mid-cervical region, unspecified level: Secondary | ICD-10-CM | POA: Diagnosis not present

## 2019-10-07 DIAGNOSIS — M9901 Segmental and somatic dysfunction of cervical region: Secondary | ICD-10-CM | POA: Diagnosis not present

## 2019-11-04 DIAGNOSIS — M5032 Other cervical disc degeneration, mid-cervical region, unspecified level: Secondary | ICD-10-CM | POA: Diagnosis not present

## 2019-11-04 DIAGNOSIS — M531 Cervicobrachial syndrome: Secondary | ICD-10-CM | POA: Diagnosis not present

## 2019-11-04 DIAGNOSIS — M9901 Segmental and somatic dysfunction of cervical region: Secondary | ICD-10-CM | POA: Diagnosis not present

## 2019-11-04 DIAGNOSIS — M5033 Other cervical disc degeneration, cervicothoracic region: Secondary | ICD-10-CM | POA: Diagnosis not present

## 2019-12-02 DIAGNOSIS — M5032 Other cervical disc degeneration, mid-cervical region, unspecified level: Secondary | ICD-10-CM | POA: Diagnosis not present

## 2019-12-02 DIAGNOSIS — M531 Cervicobrachial syndrome: Secondary | ICD-10-CM | POA: Diagnosis not present

## 2019-12-02 DIAGNOSIS — M9901 Segmental and somatic dysfunction of cervical region: Secondary | ICD-10-CM | POA: Diagnosis not present

## 2019-12-02 DIAGNOSIS — M5033 Other cervical disc degeneration, cervicothoracic region: Secondary | ICD-10-CM | POA: Diagnosis not present

## 2020-01-06 DIAGNOSIS — M531 Cervicobrachial syndrome: Secondary | ICD-10-CM | POA: Diagnosis not present

## 2020-01-06 DIAGNOSIS — M5033 Other cervical disc degeneration, cervicothoracic region: Secondary | ICD-10-CM | POA: Diagnosis not present

## 2020-01-06 DIAGNOSIS — M9901 Segmental and somatic dysfunction of cervical region: Secondary | ICD-10-CM | POA: Diagnosis not present

## 2020-01-06 DIAGNOSIS — M5032 Other cervical disc degeneration, mid-cervical region, unspecified level: Secondary | ICD-10-CM | POA: Diagnosis not present

## 2020-02-03 DIAGNOSIS — M5033 Other cervical disc degeneration, cervicothoracic region: Secondary | ICD-10-CM | POA: Diagnosis not present

## 2020-02-03 DIAGNOSIS — M531 Cervicobrachial syndrome: Secondary | ICD-10-CM | POA: Diagnosis not present

## 2020-02-03 DIAGNOSIS — M5032 Other cervical disc degeneration, mid-cervical region, unspecified level: Secondary | ICD-10-CM | POA: Diagnosis not present

## 2020-02-03 DIAGNOSIS — M9901 Segmental and somatic dysfunction of cervical region: Secondary | ICD-10-CM | POA: Diagnosis not present

## 2020-02-05 DIAGNOSIS — I10 Essential (primary) hypertension: Secondary | ICD-10-CM | POA: Diagnosis not present

## 2020-02-05 DIAGNOSIS — E1129 Type 2 diabetes mellitus with other diabetic kidney complication: Secondary | ICD-10-CM | POA: Diagnosis not present

## 2020-02-05 DIAGNOSIS — R809 Proteinuria, unspecified: Secondary | ICD-10-CM | POA: Diagnosis not present

## 2020-02-05 DIAGNOSIS — E559 Vitamin D deficiency, unspecified: Secondary | ICD-10-CM | POA: Diagnosis not present

## 2020-02-10 DIAGNOSIS — R809 Proteinuria, unspecified: Secondary | ICD-10-CM | POA: Diagnosis not present

## 2020-02-10 DIAGNOSIS — E559 Vitamin D deficiency, unspecified: Secondary | ICD-10-CM | POA: Diagnosis not present

## 2020-02-10 DIAGNOSIS — I10 Essential (primary) hypertension: Secondary | ICD-10-CM | POA: Diagnosis not present

## 2020-03-02 DIAGNOSIS — M5032 Other cervical disc degeneration, mid-cervical region, unspecified level: Secondary | ICD-10-CM | POA: Diagnosis not present

## 2020-03-02 DIAGNOSIS — M5033 Other cervical disc degeneration, cervicothoracic region: Secondary | ICD-10-CM | POA: Diagnosis not present

## 2020-03-02 DIAGNOSIS — M531 Cervicobrachial syndrome: Secondary | ICD-10-CM | POA: Diagnosis not present

## 2020-03-02 DIAGNOSIS — M9901 Segmental and somatic dysfunction of cervical region: Secondary | ICD-10-CM | POA: Diagnosis not present

## 2020-03-30 DIAGNOSIS — M5033 Other cervical disc degeneration, cervicothoracic region: Secondary | ICD-10-CM | POA: Diagnosis not present

## 2020-03-30 DIAGNOSIS — M5032 Other cervical disc degeneration, mid-cervical region, unspecified level: Secondary | ICD-10-CM | POA: Diagnosis not present

## 2020-03-30 DIAGNOSIS — M9901 Segmental and somatic dysfunction of cervical region: Secondary | ICD-10-CM | POA: Diagnosis not present

## 2020-03-30 DIAGNOSIS — M531 Cervicobrachial syndrome: Secondary | ICD-10-CM | POA: Diagnosis not present

## 2020-04-27 DIAGNOSIS — M9901 Segmental and somatic dysfunction of cervical region: Secondary | ICD-10-CM | POA: Diagnosis not present

## 2020-04-27 DIAGNOSIS — M531 Cervicobrachial syndrome: Secondary | ICD-10-CM | POA: Diagnosis not present

## 2020-04-27 DIAGNOSIS — M5032 Other cervical disc degeneration, mid-cervical region, unspecified level: Secondary | ICD-10-CM | POA: Diagnosis not present

## 2020-04-27 DIAGNOSIS — M5033 Other cervical disc degeneration, cervicothoracic region: Secondary | ICD-10-CM | POA: Diagnosis not present

## 2020-04-30 ENCOUNTER — Telehealth: Payer: Self-pay

## 2020-04-30 NOTE — Telephone Encounter (Signed)
Received call from [place] in Dr. Lennette Bihari Little's office that pt. would be coming in for his annual physical next week and would like Labs for Dr. Irene Limbo drawn during that visit.  Per Dr. Irene Limbo: faxed order for CBC/diff, CMP, SPEP with [facility] to office, Opal Sidles verbally confirmed receipt of this order.

## 2020-05-05 DIAGNOSIS — I10 Essential (primary) hypertension: Secondary | ICD-10-CM | POA: Diagnosis not present

## 2020-05-05 DIAGNOSIS — R809 Proteinuria, unspecified: Secondary | ICD-10-CM | POA: Diagnosis not present

## 2020-05-05 DIAGNOSIS — N182 Chronic kidney disease, stage 2 (mild): Secondary | ICD-10-CM | POA: Diagnosis not present

## 2020-05-05 DIAGNOSIS — E1129 Type 2 diabetes mellitus with other diabetic kidney complication: Secondary | ICD-10-CM | POA: Diagnosis not present

## 2020-05-05 DIAGNOSIS — E782 Mixed hyperlipidemia: Secondary | ICD-10-CM | POA: Diagnosis not present

## 2020-05-05 DIAGNOSIS — Z125 Encounter for screening for malignant neoplasm of prostate: Secondary | ICD-10-CM | POA: Diagnosis not present

## 2020-05-24 DIAGNOSIS — M9901 Segmental and somatic dysfunction of cervical region: Secondary | ICD-10-CM | POA: Diagnosis not present

## 2020-05-24 DIAGNOSIS — M5033 Other cervical disc degeneration, cervicothoracic region: Secondary | ICD-10-CM | POA: Diagnosis not present

## 2020-05-24 DIAGNOSIS — M531 Cervicobrachial syndrome: Secondary | ICD-10-CM | POA: Diagnosis not present

## 2020-05-24 DIAGNOSIS — M5032 Other cervical disc degeneration, mid-cervical region, unspecified level: Secondary | ICD-10-CM | POA: Diagnosis not present

## 2020-06-21 DIAGNOSIS — M531 Cervicobrachial syndrome: Secondary | ICD-10-CM | POA: Diagnosis not present

## 2020-06-21 DIAGNOSIS — M5033 Other cervical disc degeneration, cervicothoracic region: Secondary | ICD-10-CM | POA: Diagnosis not present

## 2020-06-21 DIAGNOSIS — M5032 Other cervical disc degeneration, mid-cervical region, unspecified level: Secondary | ICD-10-CM | POA: Diagnosis not present

## 2020-06-21 DIAGNOSIS — M9901 Segmental and somatic dysfunction of cervical region: Secondary | ICD-10-CM | POA: Diagnosis not present

## 2020-07-19 DIAGNOSIS — M5032 Other cervical disc degeneration, mid-cervical region, unspecified level: Secondary | ICD-10-CM | POA: Diagnosis not present

## 2020-07-19 DIAGNOSIS — M531 Cervicobrachial syndrome: Secondary | ICD-10-CM | POA: Diagnosis not present

## 2020-07-19 DIAGNOSIS — M5033 Other cervical disc degeneration, cervicothoracic region: Secondary | ICD-10-CM | POA: Diagnosis not present

## 2020-07-19 DIAGNOSIS — M9901 Segmental and somatic dysfunction of cervical region: Secondary | ICD-10-CM | POA: Diagnosis not present

## 2020-08-17 DIAGNOSIS — M5032 Other cervical disc degeneration, mid-cervical region, unspecified level: Secondary | ICD-10-CM | POA: Diagnosis not present

## 2020-08-17 DIAGNOSIS — M5033 Other cervical disc degeneration, cervicothoracic region: Secondary | ICD-10-CM | POA: Diagnosis not present

## 2020-08-17 DIAGNOSIS — M9901 Segmental and somatic dysfunction of cervical region: Secondary | ICD-10-CM | POA: Diagnosis not present

## 2020-08-17 DIAGNOSIS — M531 Cervicobrachial syndrome: Secondary | ICD-10-CM | POA: Diagnosis not present

## 2020-09-08 DIAGNOSIS — E1129 Type 2 diabetes mellitus with other diabetic kidney complication: Secondary | ICD-10-CM | POA: Diagnosis not present

## 2020-09-08 DIAGNOSIS — E782 Mixed hyperlipidemia: Secondary | ICD-10-CM | POA: Diagnosis not present

## 2020-09-14 DIAGNOSIS — M9901 Segmental and somatic dysfunction of cervical region: Secondary | ICD-10-CM | POA: Diagnosis not present

## 2020-09-14 DIAGNOSIS — M5033 Other cervical disc degeneration, cervicothoracic region: Secondary | ICD-10-CM | POA: Diagnosis not present

## 2020-09-14 DIAGNOSIS — M531 Cervicobrachial syndrome: Secondary | ICD-10-CM | POA: Diagnosis not present

## 2020-09-14 DIAGNOSIS — M5032 Other cervical disc degeneration, mid-cervical region, unspecified level: Secondary | ICD-10-CM | POA: Diagnosis not present

## 2020-10-12 DIAGNOSIS — M9901 Segmental and somatic dysfunction of cervical region: Secondary | ICD-10-CM | POA: Diagnosis not present

## 2020-10-12 DIAGNOSIS — M531 Cervicobrachial syndrome: Secondary | ICD-10-CM | POA: Diagnosis not present

## 2020-10-12 DIAGNOSIS — M5033 Other cervical disc degeneration, cervicothoracic region: Secondary | ICD-10-CM | POA: Diagnosis not present

## 2020-10-12 DIAGNOSIS — M5032 Other cervical disc degeneration, mid-cervical region, unspecified level: Secondary | ICD-10-CM | POA: Diagnosis not present

## 2022-10-31 DIAGNOSIS — M5033 Other cervical disc degeneration, cervicothoracic region: Secondary | ICD-10-CM | POA: Diagnosis not present

## 2022-10-31 DIAGNOSIS — M531 Cervicobrachial syndrome: Secondary | ICD-10-CM | POA: Diagnosis not present

## 2022-10-31 DIAGNOSIS — M9901 Segmental and somatic dysfunction of cervical region: Secondary | ICD-10-CM | POA: Diagnosis not present

## 2022-10-31 DIAGNOSIS — M5032 Other cervical disc degeneration, mid-cervical region, unspecified level: Secondary | ICD-10-CM | POA: Diagnosis not present

## 2022-12-05 DIAGNOSIS — M5033 Other cervical disc degeneration, cervicothoracic region: Secondary | ICD-10-CM | POA: Diagnosis not present

## 2022-12-05 DIAGNOSIS — M5032 Other cervical disc degeneration, mid-cervical region, unspecified level: Secondary | ICD-10-CM | POA: Diagnosis not present

## 2022-12-05 DIAGNOSIS — M531 Cervicobrachial syndrome: Secondary | ICD-10-CM | POA: Diagnosis not present

## 2022-12-05 DIAGNOSIS — M9901 Segmental and somatic dysfunction of cervical region: Secondary | ICD-10-CM | POA: Diagnosis not present

## 2023-01-02 DIAGNOSIS — M5033 Other cervical disc degeneration, cervicothoracic region: Secondary | ICD-10-CM | POA: Diagnosis not present

## 2023-01-02 DIAGNOSIS — M9901 Segmental and somatic dysfunction of cervical region: Secondary | ICD-10-CM | POA: Diagnosis not present

## 2023-01-02 DIAGNOSIS — M5032 Other cervical disc degeneration, mid-cervical region, unspecified level: Secondary | ICD-10-CM | POA: Diagnosis not present

## 2023-01-02 DIAGNOSIS — M531 Cervicobrachial syndrome: Secondary | ICD-10-CM | POA: Diagnosis not present

## 2023-01-30 DIAGNOSIS — M531 Cervicobrachial syndrome: Secondary | ICD-10-CM | POA: Diagnosis not present

## 2023-01-30 DIAGNOSIS — M5032 Other cervical disc degeneration, mid-cervical region, unspecified level: Secondary | ICD-10-CM | POA: Diagnosis not present

## 2023-01-30 DIAGNOSIS — M5033 Other cervical disc degeneration, cervicothoracic region: Secondary | ICD-10-CM | POA: Diagnosis not present

## 2023-01-30 DIAGNOSIS — M9901 Segmental and somatic dysfunction of cervical region: Secondary | ICD-10-CM | POA: Diagnosis not present

## 2023-02-26 DIAGNOSIS — M5032 Other cervical disc degeneration, mid-cervical region, unspecified level: Secondary | ICD-10-CM | POA: Diagnosis not present

## 2023-02-26 DIAGNOSIS — M5033 Other cervical disc degeneration, cervicothoracic region: Secondary | ICD-10-CM | POA: Diagnosis not present

## 2023-02-26 DIAGNOSIS — M9901 Segmental and somatic dysfunction of cervical region: Secondary | ICD-10-CM | POA: Diagnosis not present

## 2023-02-26 DIAGNOSIS — M531 Cervicobrachial syndrome: Secondary | ICD-10-CM | POA: Diagnosis not present

## 2023-03-27 DIAGNOSIS — M5032 Other cervical disc degeneration, mid-cervical region, unspecified level: Secondary | ICD-10-CM | POA: Diagnosis not present

## 2023-03-27 DIAGNOSIS — M5033 Other cervical disc degeneration, cervicothoracic region: Secondary | ICD-10-CM | POA: Diagnosis not present

## 2023-03-27 DIAGNOSIS — M531 Cervicobrachial syndrome: Secondary | ICD-10-CM | POA: Diagnosis not present

## 2023-03-27 DIAGNOSIS — M9901 Segmental and somatic dysfunction of cervical region: Secondary | ICD-10-CM | POA: Diagnosis not present

## 2023-04-24 DIAGNOSIS — M9901 Segmental and somatic dysfunction of cervical region: Secondary | ICD-10-CM | POA: Diagnosis not present

## 2023-04-24 DIAGNOSIS — M5033 Other cervical disc degeneration, cervicothoracic region: Secondary | ICD-10-CM | POA: Diagnosis not present

## 2023-04-24 DIAGNOSIS — M531 Cervicobrachial syndrome: Secondary | ICD-10-CM | POA: Diagnosis not present

## 2023-04-24 DIAGNOSIS — M5032 Other cervical disc degeneration, mid-cervical region, unspecified level: Secondary | ICD-10-CM | POA: Diagnosis not present

## 2023-05-28 DIAGNOSIS — I10 Essential (primary) hypertension: Secondary | ICD-10-CM | POA: Diagnosis not present

## 2023-05-28 DIAGNOSIS — R809 Proteinuria, unspecified: Secondary | ICD-10-CM | POA: Diagnosis not present

## 2023-05-28 DIAGNOSIS — Z6831 Body mass index (BMI) 31.0-31.9, adult: Secondary | ICD-10-CM | POA: Diagnosis not present

## 2023-05-28 DIAGNOSIS — E559 Vitamin D deficiency, unspecified: Secondary | ICD-10-CM | POA: Diagnosis not present

## 2023-05-29 DIAGNOSIS — M5033 Other cervical disc degeneration, cervicothoracic region: Secondary | ICD-10-CM | POA: Diagnosis not present

## 2023-05-29 DIAGNOSIS — M531 Cervicobrachial syndrome: Secondary | ICD-10-CM | POA: Diagnosis not present

## 2023-05-29 DIAGNOSIS — M5032 Other cervical disc degeneration, mid-cervical region, unspecified level: Secondary | ICD-10-CM | POA: Diagnosis not present

## 2023-05-29 DIAGNOSIS — M9901 Segmental and somatic dysfunction of cervical region: Secondary | ICD-10-CM | POA: Diagnosis not present

## 2023-05-30 DIAGNOSIS — R809 Proteinuria, unspecified: Secondary | ICD-10-CM | POA: Diagnosis not present

## 2023-05-30 DIAGNOSIS — E559 Vitamin D deficiency, unspecified: Secondary | ICD-10-CM | POA: Diagnosis not present

## 2023-05-30 DIAGNOSIS — I10 Essential (primary) hypertension: Secondary | ICD-10-CM | POA: Diagnosis not present

## 2023-06-11 DIAGNOSIS — R809 Proteinuria, unspecified: Secondary | ICD-10-CM | POA: Diagnosis not present

## 2023-07-10 DIAGNOSIS — M9901 Segmental and somatic dysfunction of cervical region: Secondary | ICD-10-CM | POA: Diagnosis not present

## 2023-07-10 DIAGNOSIS — M5032 Other cervical disc degeneration, mid-cervical region, unspecified level: Secondary | ICD-10-CM | POA: Diagnosis not present

## 2023-07-10 DIAGNOSIS — M531 Cervicobrachial syndrome: Secondary | ICD-10-CM | POA: Diagnosis not present

## 2023-07-10 DIAGNOSIS — M5033 Other cervical disc degeneration, cervicothoracic region: Secondary | ICD-10-CM | POA: Diagnosis not present

## 2023-07-24 DIAGNOSIS — R809 Proteinuria, unspecified: Secondary | ICD-10-CM | POA: Diagnosis not present

## 2023-08-23 DIAGNOSIS — R809 Proteinuria, unspecified: Secondary | ICD-10-CM | POA: Diagnosis not present

## 2023-08-23 DIAGNOSIS — E559 Vitamin D deficiency, unspecified: Secondary | ICD-10-CM | POA: Diagnosis not present

## 2023-08-23 DIAGNOSIS — Z6831 Body mass index (BMI) 31.0-31.9, adult: Secondary | ICD-10-CM | POA: Diagnosis not present

## 2023-08-23 DIAGNOSIS — I10 Essential (primary) hypertension: Secondary | ICD-10-CM | POA: Diagnosis not present

## 2023-08-27 DIAGNOSIS — M5032 Other cervical disc degeneration, mid-cervical region, unspecified level: Secondary | ICD-10-CM | POA: Diagnosis not present

## 2023-08-27 DIAGNOSIS — M5033 Other cervical disc degeneration, cervicothoracic region: Secondary | ICD-10-CM | POA: Diagnosis not present

## 2023-08-27 DIAGNOSIS — M531 Cervicobrachial syndrome: Secondary | ICD-10-CM | POA: Diagnosis not present

## 2023-08-27 DIAGNOSIS — M9901 Segmental and somatic dysfunction of cervical region: Secondary | ICD-10-CM | POA: Diagnosis not present

## 2023-08-31 DIAGNOSIS — I10 Essential (primary) hypertension: Secondary | ICD-10-CM | POA: Diagnosis not present

## 2023-08-31 DIAGNOSIS — E559 Vitamin D deficiency, unspecified: Secondary | ICD-10-CM | POA: Diagnosis not present

## 2023-08-31 DIAGNOSIS — R809 Proteinuria, unspecified: Secondary | ICD-10-CM | POA: Diagnosis not present

## 2023-09-12 DIAGNOSIS — I10 Essential (primary) hypertension: Secondary | ICD-10-CM | POA: Diagnosis not present

## 2023-09-12 DIAGNOSIS — E041 Nontoxic single thyroid nodule: Secondary | ICD-10-CM | POA: Diagnosis not present

## 2023-09-12 DIAGNOSIS — E1165 Type 2 diabetes mellitus with hyperglycemia: Secondary | ICD-10-CM | POA: Diagnosis not present

## 2023-09-12 DIAGNOSIS — E782 Mixed hyperlipidemia: Secondary | ICD-10-CM | POA: Diagnosis not present

## 2023-09-12 DIAGNOSIS — E11319 Type 2 diabetes mellitus with unspecified diabetic retinopathy without macular edema: Secondary | ICD-10-CM | POA: Diagnosis not present

## 2023-09-24 DIAGNOSIS — M5033 Other cervical disc degeneration, cervicothoracic region: Secondary | ICD-10-CM | POA: Diagnosis not present

## 2023-09-24 DIAGNOSIS — M531 Cervicobrachial syndrome: Secondary | ICD-10-CM | POA: Diagnosis not present

## 2023-09-24 DIAGNOSIS — M9901 Segmental and somatic dysfunction of cervical region: Secondary | ICD-10-CM | POA: Diagnosis not present

## 2023-09-24 DIAGNOSIS — M5032 Other cervical disc degeneration, mid-cervical region, unspecified level: Secondary | ICD-10-CM | POA: Diagnosis not present

## 2023-10-22 ENCOUNTER — Other Ambulatory Visit: Payer: Self-pay

## 2023-10-22 DIAGNOSIS — E1165 Type 2 diabetes mellitus with hyperglycemia: Secondary | ICD-10-CM

## 2023-10-22 DIAGNOSIS — M9901 Segmental and somatic dysfunction of cervical region: Secondary | ICD-10-CM | POA: Diagnosis not present

## 2023-10-22 DIAGNOSIS — M5033 Other cervical disc degeneration, cervicothoracic region: Secondary | ICD-10-CM | POA: Diagnosis not present

## 2023-10-22 DIAGNOSIS — M5032 Other cervical disc degeneration, mid-cervical region, unspecified level: Secondary | ICD-10-CM | POA: Diagnosis not present

## 2023-10-22 DIAGNOSIS — M531 Cervicobrachial syndrome: Secondary | ICD-10-CM | POA: Diagnosis not present

## 2023-10-23 ENCOUNTER — Other Ambulatory Visit: Payer: BC Managed Care – PPO

## 2023-10-23 DIAGNOSIS — E1165 Type 2 diabetes mellitus with hyperglycemia: Secondary | ICD-10-CM | POA: Diagnosis not present

## 2023-10-23 DIAGNOSIS — Z794 Long term (current) use of insulin: Secondary | ICD-10-CM | POA: Diagnosis not present

## 2023-10-24 LAB — LIPID PANEL
Cholesterol: 195 mg/dL (ref <200)
HDL: 32 mg/dL — ABNORMAL LOW (ref >=40)
Non-HDL Cholesterol (Calc): 163 mg/dL — ABNORMAL HIGH (ref <130)
Total CHOL/HDL Ratio: 6.1 (calc) — ABNORMAL HIGH (ref <5.0)
Triglycerides: 424 mg/dL — ABNORMAL HIGH (ref <150)

## 2023-10-24 LAB — MICROALBUMIN / CREATININE URINE RATIO
Creatinine, Urine: 46 mg/dL (ref 20–320)
Microalb Creat Ratio: 200 mg/g{creat} — ABNORMAL HIGH (ref <30)
Microalb, Ur: 9.2 mg/dL

## 2023-10-24 LAB — HEMOGLOBIN A1C
Hgb A1c MFr Bld: 9.4 %{Hb} — ABNORMAL HIGH (ref <5.7)
Mean Plasma Glucose: 223 mg/dL
eAG (mmol/L): 12.4 mmol/L

## 2023-10-30 ENCOUNTER — Encounter: Payer: Self-pay | Admitting: "Endocrinology

## 2023-10-30 ENCOUNTER — Ambulatory Visit: Payer: BC Managed Care – PPO | Admitting: "Endocrinology

## 2023-10-30 VITALS — BP 140/84 | HR 72 | Ht 69.0 in | Wt 198.0 lb

## 2023-10-30 DIAGNOSIS — E1165 Type 2 diabetes mellitus with hyperglycemia: Secondary | ICD-10-CM

## 2023-10-30 DIAGNOSIS — E782 Mixed hyperlipidemia: Secondary | ICD-10-CM

## 2023-10-30 DIAGNOSIS — Z7984 Long term (current) use of oral hypoglycemic drugs: Secondary | ICD-10-CM

## 2023-10-30 MED ORDER — TIRZEPATIDE 2.5 MG/0.5ML ~~LOC~~ SOAJ: 2.5000 mg | SUBCUTANEOUS | 3 refills | Status: DC

## 2023-10-30 MED ORDER — DEXCOM G7 SENSOR MISC: 1.0000 | 0 refills | Status: DC

## 2023-10-30 NOTE — Progress Notes (Signed)
Outpatient Endocrinology Note Francis Opdyke, MD  10/30/23   Francis Silva 04/20/1961 [id number]  Referring Provider: Jarrett Soho, PA-C Primary Care Provider: Catha Gosselin, MD Reason for consultation: Subjective   Assessment & Plan  Diagnoses and all orders for this visit:  Uncontrolled type 2 diabetes mellitus with hyperglycemia (HCC)  Long term (current) use of oral hypoglycemic drugs  Mixed hypercholesterolemia and hypertriglyceridemia  Other orders -     tirzepatide [facility]) 2.5 MG/0.5ML Pen; Inject 2.5 mg into the skin once a week. -     Continuous Glucose Sensor (DEXCOM G7 SENSOR) MISC; 1 Device by Does not apply route continuous.   Diabetes Type II complicated by neuropathy, No results found for: "GFR" Hba1c goal less than 7, current Hba1c is  Lab Results  Component Value Date   HGBA1C 9.4 (H) 10/23/2023   Will recommend the following: Doesn't want insulin Metformin XR 500 mg 4 pills a day  Glimepiride 4 mg bid Start Mounajaro 2.5 mg/week  Ordered DexCom  Stop Januvia 100 mg every day   No known contraindications/side effects to any of above medications No history of MEN syndrome/medullary thyroid cancer/pancreatitis or pancreatic cancer in self or family  -Last LD and Tg are as follows: 08/2023 LDL 86, Tg 302  Lab Results  Component Value Date   [facility]  10/23/2023     Comment:     . LDL cholesterol not calculated. Triglyceride levels greater than 400 mg/dL invalidate calculated LDL results. . Reference range: <100 . Desirable range <100 mg/dL for primary prevention;   <70 mg/dL for patients with CHD or diabetic patients  with > or = 2 CHD risk factors. Marland Kitchen LDL-C is now calculated using the Martin-Hopkins  calculation, which is a validated novel method providing  better accuracy than the Friedewald equation in the  estimation of LDL-C.  Horald Pollen et al. Lenox Ahr. 5784;696(29): 2061-2068   (http://education.[facility].com/faq/FAQ164)     Lab Results  Component Value Date   TRIG 424 (H) 10/23/2023   -On rosuvastatin 5 mg QD -Follow low fat diet and exercise   -Blood pressure goal <140/90 - Microalbumin/creatinine goal is < 30 -Last MA/Cr is as follows: Lab Results  Component Value Date   MICROALBUR 9.2 10/23/2023   -on ACE/ARB Benazepril 20 mg qd -diet changes including salt restriction -limit eating outside -counseled BP targets per standards of diabetes care -uncontrolled blood pressure can lead to retinopathy, nephropathy and cardiovascular and atherosclerotic heart disease  Reviewed and counseled on: -A1C target -Blood sugar targets -Complications of uncontrolled diabetes  -Checking blood sugar before meals and bedtime and bring log next visit -All medications with mechanism of action and side effects -Hypoglycemia management: rule of 15's, Glucagon Emergency Kit and medical alert ID -low-carb low-fat plate-method diet -At least 20 minutes of physical activity per day -Annual dilated retinal eye exam and foot exam -compliance and follow up needs -follow up as scheduled or earlier if problem gets worse  Call if blood sugar is less than 70 or consistently above 250    Take a 15 gm snack of carbohydrate at bedtime before you go to sleep if your blood sugar is less than 100.    If you are going to fast after midnight for a test or procedure, ask your physician for instructions on how to reduce/decrease your insulin dose.    Call if blood sugar is less than 70 or consistently above 250  -Treating a low sugar by rule of 15  (15 gms  of sugar every 15 min until sugar is more than 70) If you feel your sugar is low, test your sugar to be sure If your sugar is low (less than 70), then take 15 grams of a fast acting Carbohydrate (3-4 glucose tablets or glucose gel or 4 ounces of juice or regular soda) Recheck your sugar 15 min after treating low to make  sure it is more than 70 If sugar is still less than 70, treat again with 15 grams of carbohydrate          Don't drive the hour of hypoglycemia  If unconscious/unable to eat or drink by mouth, use glucagon injection or nasal spray baqsimi and call 911. Can repeat again in 15 min if still unconscious.  Return in about 4 weeks (around 11/27/2023).   I have reviewed current medications, nurse's notes, allergies, vital signs, past medical and surgical history, family medical history, and social history for this encounter. Counseled patient on symptoms, examination findings, lab findings, imaging results, treatment decisions and monitoring and prognosis. The patient understood the recommendations and agrees with the treatment plan. All questions regarding treatment plan were fully answered.  Francis Bolivar, MD  10/30/23   History of Present Illness Francis Silva is a 62 y.o. year old male who presents for evaluation of Type II diabetes mellitus.  Francis Silva was first diagnosed in 1999.   Diabetes education +  Home diabetes regimen: Metformin XR 500 mg 4 pills a day  Glimepiride 4 mg bid Januvia 100 mg every day   COMPLICATIONS -  MI/Stroke -  retinopathy +  neuropathy -  nephropathy  SYMPTOMS REVIEWED - Polyuria - Weight loss - Blurred vision  BLOOD SUGAR DATA Rarely checks  Physical Exam  BP (!) 140/84   Pulse 72   Ht 5\' 9"  (1.753 m)   Wt 198 lb (89.8 kg)   SpO2 95%   BMI 29.24 kg/m    Constitutional: well developed, well nourished Head: normocephalic, atraumatic Eyes: sclera anicteric, no redness Neck: supple Lungs: normal respiratory effort Neurology: alert and oriented Skin: dry, no appreciable rashes Musculoskeletal: no appreciable defects Psychiatric: normal mood and affect Diabetic Foot Exam - Simple   No data filed      Current Medications Patient's Medications  New Prescriptions   CONTINUOUS GLUCOSE SENSOR (DEXCOM G7 SENSOR) MISC    1  Device by Does not apply route continuous.   TIRZEPATIDE (MOUNJARO) 2.5 MG/0.5ML PEN    Inject 2.5 mg into the skin once a week.  Previous Medications   AMLODIPINE (NORVASC) 10 MG TABLET    TK 1 T PO QD   ASCORBIC ACID (VITAMIN C) 1000 MG TABLET    Take 1,000 mg by mouth daily.   ASPIRIN EC 81 MG TABLET    Take 81 mg by mouth daily.   BAYER CONTOUR NEXT TEST TEST STRIP    CHECK BLOOD SUGAR ONCE DAILY   BENAZEPRIL-HYDROCHLORTHIAZIDE (LOTENSIN HCT) 20-12.5 MG TABLET    TK 2 TS PO QD   CHOLECALCIFEROL (VITAMIN D PO)    Take 2,000 Units by mouth.    GLIMEPIRIDE (AMARYL) 4 MG TABLET    TK 1 T PO BID WC   METFORMIN (GLUCOPHAGE-XR) 500 MG 24 HR TABLET    TK 4 TS PO QAM   METOPROLOL SUCCINATE (TOPROL-XL) 100 MG 24 HR TABLET    TK 1 T PO BID   POTASSIUM CHLORIDE (MICRO-K) 10 MEQ CR CAPSULE    TK 1 C PO QD  WF   WELCHOL 625 MG TABLET    TK 3 TS PO BID WC  Modified Medications   No medications on file  Discontinued Medications   JANUVIA 100 MG TABLET    TK 1 T PO QD    Allergies No Known Allergies  Past Medical History Past Medical History:  Diagnosis Date   Diabetes (HCC)    HTN (hypertension)    Hypokalemia     Past Surgical History Past Surgical History:  Procedure Laterality Date   COLONOSCOPY  2018    Family History family history includes Diabetes in his sister; Healthy in his sister; Heart attack in his mother; Heart disease in his father and mother; Thyroid disease in his sister.  Social History Social History   Socioeconomic History   Marital status: Married    Spouse name: Not on file   Number of children: Not on file   Years of education: Not on file   Highest education level: Not on file  Occupational History   Not on file  Tobacco Use   Smoking status: Never   Smokeless tobacco: Never  Vaping Use   Vaping status: Never Used  Substance and Sexual Activity   Alcohol use: Yes    Comment: Social - weekly   Drug use: No   Sexual activity: Yes  Other Topics  Concern   Not on file  Social History Narrative   Not on file   Social Determinants of Health   Financial Resource Strain: Not on file  Food Insecurity: Not on file  Transportation Needs: Not on file  Physical Activity: Not on file  Stress: Not on file  Social Connections: Not on file  Intimate Partner Violence: Not on file    Lab Results  Component Value Date   HGBA1C 9.4 (H) 10/23/2023   Lab Results  Component Value Date   CHOL 195 10/23/2023   Lab Results  Component Value Date   HDL 32 (L) 10/23/2023   Lab Results  Component Value Date   [facility]  10/23/2023     Comment:     . LDL cholesterol not calculated. Triglyceride levels greater than 400 mg/dL invalidate calculated LDL results. . Reference range: <100 . Desirable range <100 mg/dL for primary prevention;   <70 mg/dL for patients with CHD or diabetic patients  with > or = 2 CHD risk factors. Marland Kitchen LDL-C is now calculated using the Martin-Hopkins  calculation, which is a validated novel method providing  better accuracy than the Friedewald equation in the  estimation of LDL-C.  Horald Pollen et al. Lenox Ahr. 2725;366(44): 2061-2068  (http://education.[facility].com/faq/FAQ164)    Lab Results  Component Value Date   TRIG 424 (H) 10/23/2023   Lab Results  Component Value Date   CHOLHDL 6.1 (H) 10/23/2023   Lab Results  Component Value Date   CREATININE 1.20 01/02/2018   No results found for: "GFR" Lab Results  Component Value Date   MICROALBUR 9.2 10/23/2023      Component Value Date/Time   NA 136 01/02/2018 1224   NA 138 08/06/2017 1144   K 3.5 01/02/2018 1224   K 3.6 08/06/2017 1144   CL 97 (L) 01/02/2018 1224   CO2 26 01/02/2018 1224   CO2 30 (H) 08/06/2017 1144   GLUCOSE 291 (H) 01/02/2018 1224   GLUCOSE 262 (H) 08/06/2017 1144   BUN 15 01/02/2018 1224   BUN 13.1 08/06/2017 1144   CREATININE 1.20 01/02/2018 1224   CREATININE 1.1 08/06/2017 1144   CALCIUM  9.5 01/02/2018 1224    CALCIUM 10.1 08/06/2017 1144   PROT 7.4 01/02/2018 1224   PROT 8.0 08/06/2017 1144   PROT 7.4 08/06/2017 1144   ALBUMIN 4.1 01/02/2018 1224   ALBUMIN 4.3 08/06/2017 1144   AST 74 (H) 01/02/2018 1224   AST 48 (H) 08/06/2017 1144   ALT 143 (H) 01/02/2018 1224   ALT 99 (H) 08/06/2017 1144   ALKPHOS 72 01/02/2018 1224   ALKPHOS 63 08/06/2017 1144   BILITOT 0.7 01/02/2018 1224   BILITOT 1.16 08/06/2017 1144   GFRNONAA >60 01/02/2018 1224   GFRAA >60 01/02/2018 1224      Latest Ref Rng & Units 01/02/2018   12:24 PM 08/06/2017   11:44 AM  BMP  Glucose 70 - 140 mg/dL 295  621   BUN 7 - 26 mg/dL 15  30.8   Creatinine 6.57 - 1.30 mg/dL 8.46  1.1   Sodium 962 - 145 mmol/L 136  138   Potassium 3.5 - 5.1 mmol/L 3.5  3.6   Chloride 98 - 109 mmol/L 97    CO2 22 - 29 mmol/L 26  30   Calcium 8.4 - 10.4 mg/dL 9.5  95.2        Component Value Date/Time   WBC 7.3 01/02/2018 1224   WBC 7.9 08/06/2017 1144   WBC 7.0 11/02/2008 0905   RBC 4.86 01/02/2018 1224   RBC 4.86 01/02/2018 1224   HGB 14.9 01/02/2018 1224   HGB 15.0 08/06/2017 1144   HCT 41.9 01/02/2018 1224   HCT 42.8 08/06/2017 1144   PLT 188 01/02/2018 1224   PLT 180 08/06/2017 1144   MCV 86.2 01/02/2018 1224   MCV 86.1 08/06/2017 1144   MCH 30.7 01/02/2018 1224   MCHC 35.6 01/02/2018 1224   RDW 11.9 01/02/2018 1224   RDW 12.2 08/06/2017 1144   LYMPHSABS 1.7 01/02/2018 1224   LYMPHSABS 1.4 08/06/2017 1144   MONOABS 0.5 01/02/2018 1224   MONOABS 0.9 08/06/2017 1144   EOSABS 0.3 01/02/2018 1224   EOSABS 0.2 08/06/2017 1144   BASOSABS 0.0 01/02/2018 1224   BASOSABS 0.0 08/06/2017 1144     Parts of this note may have been dictated using voice recognition software. There may be variances in spelling and vocabulary which are unintentional. Not all errors are proofread. Please notify the Thereasa Parkin if any discrepancies are noted or if the meaning of any statement is not clear.

## 2023-10-30 NOTE — Patient Instructions (Signed)

## 2023-10-31 ENCOUNTER — Other Ambulatory Visit (HOSPITAL_COMMUNITY): Payer: Self-pay

## 2023-10-31 ENCOUNTER — Telehealth: Payer: Self-pay

## 2023-10-31 NOTE — Telephone Encounter (Signed)
Pharmacy Patient Advocate Encounter   Received notification from CoverMyMeds that prior authorization for Dexcom G7 sensor is required/requested.   Insurance verification completed.   The patient is insured through [facility] .   Per test claim: PA required; PA submitted to above mentioned insurance via CoverMyMeds Key/confirmation #/EOC [id number] Status is pending

## 2023-11-09 NOTE — Telephone Encounter (Signed)
Pharmacy Patient Advocate Encounter  Received notification from [facility] that Prior Authorization for Dexcom G7 sensor has been DENIED.  Full denial letter will be uploaded to the media tab. See denial reason below.

## 2023-11-26 ENCOUNTER — Telehealth: Payer: Self-pay

## 2023-11-26 ENCOUNTER — Other Ambulatory Visit (HOSPITAL_COMMUNITY): Payer: Self-pay

## 2023-11-26 NOTE — Telephone Encounter (Signed)
Pharmacy Patient Advocate Encounter   Received notification from Pt Calls Messages that prior authorization for [facility] 2.5mg  is required/requested.   Insurance verification completed.   The patient is insured through [facility] .   Per test claim: Insurance only covers this dose once per 180 days. They want him to go up to the next dose (5mg ) to be covered.

## 2023-11-26 NOTE — Telephone Encounter (Signed)
Patient needs a PA for the [facility] prior to his next fill.

## 2023-11-27 ENCOUNTER — Encounter: Payer: Self-pay | Admitting: "Endocrinology

## 2023-11-27 ENCOUNTER — Ambulatory Visit: Payer: BC Managed Care – PPO | Admitting: "Endocrinology

## 2023-11-27 VITALS — BP 120/70 | HR 72 | Ht 69.0 in | Wt 197.4 lb

## 2023-11-27 DIAGNOSIS — Z7985 Long-term (current) use of injectable non-insulin antidiabetic drugs: Secondary | ICD-10-CM | POA: Diagnosis not present

## 2023-11-27 DIAGNOSIS — E782 Mixed hyperlipidemia: Secondary | ICD-10-CM | POA: Diagnosis not present

## 2023-11-27 DIAGNOSIS — Z7984 Long term (current) use of oral hypoglycemic drugs: Secondary | ICD-10-CM | POA: Diagnosis not present

## 2023-11-27 DIAGNOSIS — E1165 Type 2 diabetes mellitus with hyperglycemia: Secondary | ICD-10-CM

## 2023-11-27 MED ORDER — TIRZEPATIDE 5 MG/0.5ML ~~LOC~~ SOAJ: 5.0000 mg | SUBCUTANEOUS | 0 refills | Status: DC

## 2023-11-27 NOTE — Patient Instructions (Signed)

## 2023-11-27 NOTE — Progress Notes (Signed)
 Outpatient Endocrinology Note Obadiah Birmingham, MD  11/27/23   Francis Silva 1961-05-27 [id number]  Referring Provider: Morgan Drivers, MD Primary Care Provider: Morgan Drivers, MD Reason for consultation: Subjective   Assessment & Plan  Diagnoses and all orders for this visit:  Uncontrolled type 2 diabetes mellitus with hyperglycemia (HCC)  Long term (current) use of oral hypoglycemic drugs  Long-term (current) use of injectable non-insulin antidiabetic drugs  Mixed hypercholesterolemia and hypertriglyceridemia  Other orders -     tirzepatide  (MOUNJARO ) 5 MG/0.5ML Pen; Inject 5 mg into the skin once a week.   Diabetes Type II complicated by neuropathy, No results found for: GFR Hba1c goal less than 7, current Hba1c is  Lab Results  Component Value Date   HGBA1C 9.4 (H) 10/23/2023   Will recommend the following: Doesn't want insulin Metformin XR 500 mg 4 pills a day Jardiance 25 mg every day   Glimepiride  4 mg bid Mounjaro  5 mg/week  Denied DexCom was denied   Stopped Januvia 100 mg every day   No known contraindications/side effects to any of above medications No history of MEN syndrome/medullary thyroid  cancer/pancreatitis or pancreatic cancer in self or family  -Last LD and Tg are as follows: 08/2023 LDL 86, Tg 302  Lab Results  Component Value Date   [facility]  10/23/2023     Comment:     . LDL cholesterol not calculated. Triglyceride levels greater than 400 mg/dL invalidate calculated LDL results. . Reference range: <100 . Desirable range <100 mg/dL for primary prevention;   <70 mg/dL for patients with CHD or diabetic patients  with > or = 2 CHD risk factors. [name] is now calculated using the Martin-Hopkins  calculation, which is a validated novel method providing  better accuracy than the Friedewald equation in the  estimation of LDL-C.  [name] SANDREA. 7986;689(80): 2061-2068  (http://education.[facility].com/faq/FAQ164)      Lab Results  Component Value Date   TRIG 424 (H) 10/23/2023   -On rosuvastatin  5 mg every day, fenofibrate 160 mg every day  -Follow low fat diet and exercise   -Blood pressure goal <140/90 - Microalbumin/creatinine goal is < 30 -Last MA/Cr is as follows: Lab Results  Component Value Date   MICROALBUR 9.2 10/23/2023   -on ACE/ARB Benazepril 20 mg qd -diet changes including salt restriction -limit eating outside -counseled BP targets per standards of diabetes care -uncontrolled blood pressure can lead to retinopathy, nephropathy and cardiovascular and atherosclerotic heart disease  Reviewed and counseled on: -A1C target -Blood sugar targets -Complications of uncontrolled diabetes  -Checking blood sugar before meals and bedtime and bring log next visit -All medications with mechanism of action and side effects -Hypoglycemia management: rule of 15's, Glucagon Emergency Kit and medical alert ID -low-carb low-fat plate-method diet -At least 20 minutes of physical activity per day -Annual dilated retinal eye exam and foot exam -compliance and follow up needs -follow up as scheduled or earlier if problem gets worse  Call if blood sugar is less than 70 or consistently above 250    Take a 15 gm snack of carbohydrate at bedtime before you go to sleep if your blood sugar is less than 100.    If you are going to fast after midnight for a test or procedure, ask your physician for instructions on how to reduce/decrease your insulin dose.    Call if blood sugar is less than 70 or consistently above 250  -Treating a low sugar by rule of  15  (15 gms of sugar every 15 min until sugar is more than 70) If you feel your sugar is low, test your sugar to be sure If your sugar is low (less than 70), then take 15 grams of a fast acting Carbohydrate (3-4 glucose tablets or glucose gel or 4 ounces of juice or regular soda) Recheck your sugar 15 min after treating low to make sure it is more  than 70 If sugar is still less than 70, treat again with 15 grams of carbohydrate          Don't drive the hour of hypoglycemia  If unconscious/unable to eat or drink by mouth, use glucagon injection or nasal spray baqsimi and call 911. Can repeat again in 15 min if still unconscious.  Return in about 4 weeks (around 12/25/2023).  I have reviewed current medications, nurse's notes, allergies, vital signs, past medical and surgical history, family medical history, and social history for this encounter. Counseled patient on symptoms, examination findings, lab findings, imaging results, treatment decisions and monitoring and prognosis. The patient understood the recommendations and agrees with the treatment plan. All questions regarding treatment plan were fully answered.  Obadiah Birmingham, MD  11/27/23   History of Present Illness Francis Silva is a 62 y.o. year old male who presents for follow up of Type II diabetes mellitus.  Francis Silva was first diagnosed in 1999.   Diabetes education +  Home diabetes regimen: Metformin XR 500 mg 4 pills a day  Glimepiride  4 mg bid Mounjaro  2.5 mg/week   COMPLICATIONS -  MI/Stroke -  retinopathy +  neuropathy -  nephropathy  BLOOD SUGAR DATA Checks 95-194  Physical Exam  BP 120/70   Pulse 72   Ht 5' 9 (1.753 m)   Wt 197 lb 6.4 oz (89.5 kg)   SpO2 94%   BMI 29.15 kg/m    Constitutional: well developed, well nourished Head: normocephalic, atraumatic Eyes: sclera anicteric, no redness Neck: supple Lungs: normal respiratory effort Neurology: alert and oriented Skin: dry, no appreciable rashes Musculoskeletal: no appreciable defects Psychiatric: normal mood and affect Diabetic Foot Exam - Simple   Simple Foot Form Diabetic Foot exam was performed with the following findings: Yes 11/27/2023  2:56 PM  Visual Inspection No deformities, no ulcerations, no other skin breakdown bilaterally: Yes Sensation Testing Intact to touch  and monofilament testing bilaterally: Yes Pulse Check Posterior Tibialis and Dorsalis pulse intact bilaterally: Yes Comments      Current Medications Patient's Medications  New Prescriptions   TIRZEPATIDE  (MOUNJARO ) 5 MG/0.5ML PEN    Inject 5 mg into the skin once a week.  Previous Medications   AMLODIPINE (NORVASC) 10 MG TABLET    TK 1 T PO QD   ASCORBIC ACID (VITAMIN C) 1000 MG TABLET    Take 1,000 mg by mouth daily.   ASPIRIN EC 81 MG TABLET    Take 81 mg by mouth daily.   BAYER CONTOUR NEXT TEST TEST STRIP    CHECK BLOOD SUGAR ONCE DAILY   BENAZEPRIL-HYDROCHLORTHIAZIDE (LOTENSIN HCT) 20-12.5 MG TABLET    TK 2 TS PO QD   CHOLECALCIFEROL (VITAMIN D PO)    Take 2,000 Units by mouth.    FENOFIBRATE 160 MG TABLET    1 tablet Orally Once a day for 90 days   GLIMEPIRIDE  (AMARYL ) 4 MG TABLET    TK 1 T PO BID WC   JARDIANCE 25 MG TABS TABLET    Take 25 mg by  mouth daily.   METFORMIN (GLUCOPHAGE-XR) 500 MG 24 HR TABLET    TK 4 TS PO QAM   METOPROLOL SUCCINATE (TOPROL-XL) 100 MG 24 HR TABLET    TK 1 T PO BID   POTASSIUM CHLORIDE (MICRO-K) 10 MEQ CR CAPSULE    TK 1 C PO QD WF   ROSUVASTATIN  (CRESTOR ) 5 MG TABLET    Take 5 mg by mouth daily.  Modified Medications   No medications on file  Discontinued Medications   CONTINUOUS GLUCOSE SENSOR (DEXCOM G7 SENSOR) MISC    1 Device by Does not apply route continuous.   TIRZEPATIDE  (MOUNJARO ) 2.5 MG/0.5ML PEN    Inject 2.5 mg into the skin once a week.   WELCHOL 625 MG TABLET    TK 3 TS PO BID WC    Allergies No Known Allergies  Past Medical History Past Medical History:  Diagnosis Date   Diabetes (HCC)    HTN (hypertension)    Hypokalemia     Past Surgical History Past Surgical History:  Procedure Laterality Date   COLONOSCOPY  2018    Family History family history includes Diabetes in his sister; Healthy in his sister; Heart attack in his mother; Heart disease in his father and mother; Thyroid  disease in his sister.  Social  History Social History   Socioeconomic History   Marital status: Married    Spouse name: Not on file   Number of children: Not on file   Years of education: Not on file   Highest education level: Not on file  Occupational History   Not on file  Tobacco Use   Smoking status: Never   Smokeless tobacco: Never  Vaping Use   Vaping status: Never Used  Substance and Sexual Activity   Alcohol use: Yes    Comment: Social - weekly   Drug use: No   Sexual activity: Yes  Other Topics Concern   Not on file  Social History Narrative   Not on file   Social Drivers of Health   Financial Resource Strain: Not on file  Food Insecurity: Not on file  Transportation Needs: Not on file  Physical Activity: Not on file  Stress: Not on file  Social Connections: Not on file  Intimate Partner Violence: Not on file    Lab Results  Component Value Date   HGBA1C 9.4 (H) 10/23/2023   Lab Results  Component Value Date   CHOL 195 10/23/2023   Lab Results  Component Value Date   HDL 32 (L) 10/23/2023   Lab Results  Component Value Date   [facility]  10/23/2023     Comment:     . LDL cholesterol not calculated. Triglyceride levels greater than 400 mg/dL invalidate calculated LDL results. . Reference range: <100 . Desirable range <100 mg/dL for primary prevention;   <70 mg/dL for patients with CHD or diabetic patients  with > or = 2 CHD risk factors. [name] is now calculated using the Martin-Hopkins  calculation, which is a validated novel method providing  better accuracy than the Friedewald equation in the  estimation of LDL-C.  [name] SANDREA. 7986;689(80): 2061-2068  (http://education.[facility].com/faq/FAQ164)    Lab Results  Component Value Date   TRIG 424 (H) 10/23/2023   Lab Results  Component Value Date   CHOLHDL 6.1 (H) 10/23/2023   Lab Results  Component Value Date   CREATININE 1.20 01/02/2018   No results found for: GFR Lab Results  Component  Value Date  MICROALBUR 9.2 10/23/2023      Component Value Date/Time   NA 136 01/02/2018 1224   NA 138 08/06/2017 1144   K 3.5 01/02/2018 1224   K 3.6 08/06/2017 1144   CL 97 (L) 01/02/2018 1224   CO2 26 01/02/2018 1224   CO2 30 (H) 08/06/2017 1144   GLUCOSE 291 (H) 01/02/2018 1224   GLUCOSE 262 (H) 08/06/2017 1144   BUN 15 01/02/2018 1224   BUN 13.1 08/06/2017 1144   CREATININE 1.20 01/02/2018 1224   CREATININE 1.1 08/06/2017 1144   CALCIUM  9.5 01/02/2018 1224   CALCIUM  10.1 08/06/2017 1144   PROT 7.4 01/02/2018 1224   PROT 8.0 08/06/2017 1144   PROT 7.4 08/06/2017 1144   ALBUMIN 4.1 01/02/2018 1224   ALBUMIN 4.3 08/06/2017 1144   AST 74 (H) 01/02/2018 1224   AST 48 (H) 08/06/2017 1144   ALT 143 (H) 01/02/2018 1224   ALT 99 (H) 08/06/2017 1144   ALKPHOS 72 01/02/2018 1224   ALKPHOS 63 08/06/2017 1144   BILITOT 0.7 01/02/2018 1224   BILITOT 1.16 08/06/2017 1144   GFRNONAA >60 01/02/2018 1224   GFRAA >60 01/02/2018 1224      Latest Ref Rng & Units 01/02/2018   12:24 PM 08/06/2017   11:44 AM  BMP  Glucose 70 - 140 mg/dL 708  737   BUN 7 - 26 mg/dL 15  86.8   Creatinine 9.29 - 1.30 mg/dL 8.79  1.1   Sodium 863 - 145 mmol/L 136  138   Potassium 3.5 - 5.1 mmol/L 3.5  3.6   Chloride 98 - 109 mmol/L 97    CO2 22 - 29 mmol/L 26  30   Calcium  8.4 - 10.4 mg/dL 9.5  89.8        Component Value Date/Time   WBC 7.3 01/02/2018 1224   WBC 7.9 08/06/2017 1144   WBC 7.0 11/02/2008 0905   RBC 4.86 01/02/2018 1224   RBC 4.86 01/02/2018 1224   HGB 14.9 01/02/2018 1224   HGB 15.0 08/06/2017 1144   HCT 41.9 01/02/2018 1224   HCT 42.8 08/06/2017 1144   PLT 188 01/02/2018 1224   PLT 180 08/06/2017 1144   MCV 86.2 01/02/2018 1224   MCV 86.1 08/06/2017 1144   MCH 30.7 01/02/2018 1224   MCHC 35.6 01/02/2018 1224   RDW 11.9 01/02/2018 1224   RDW 12.2 08/06/2017 1144   LYMPHSABS 1.7 01/02/2018 1224   LYMPHSABS 1.4 08/06/2017 1144   MONOABS 0.5 01/02/2018 1224   MONOABS 0.9  08/06/2017 1144   EOSABS 0.3 01/02/2018 1224   EOSABS 0.2 08/06/2017 1144   BASOSABS 0.0 01/02/2018 1224   BASOSABS 0.0 08/06/2017 1144     Parts of this note may have been dictated using voice recognition software. There may be variances in spelling and vocabulary which are unintentional. Not all errors are proofread. Please notify the dino if any discrepancies are noted or if the meaning of any statement is not clear.

## 2023-12-03 DIAGNOSIS — M5032 Other cervical disc degeneration, mid-cervical region, unspecified level: Secondary | ICD-10-CM | POA: Diagnosis not present

## 2023-12-03 DIAGNOSIS — M5033 Other cervical disc degeneration, cervicothoracic region: Secondary | ICD-10-CM | POA: Diagnosis not present

## 2023-12-03 DIAGNOSIS — M9901 Segmental and somatic dysfunction of cervical region: Secondary | ICD-10-CM | POA: Diagnosis not present

## 2023-12-03 DIAGNOSIS — M531 Cervicobrachial syndrome: Secondary | ICD-10-CM | POA: Diagnosis not present

## 2023-12-25 ENCOUNTER — Telehealth: Payer: Self-pay

## 2023-12-25 MED ORDER — TIRZEPATIDE 5 MG/0.5ML ~~LOC~~ SOAJ: 5.0000 mg | SUBCUTANEOUS | 2 refills | Status: DC

## 2023-12-25 NOTE — Telephone Encounter (Signed)
Requested Prescriptions   Signed Prescriptions Disp Refills   tirzepatide (MOUNJARO) 5 MG/0.5ML Pen 2 mL 2    Sig: Inject 5 mg into the skin once a week.    Authorizing Provider: Altamese [employer]ote    Ordering User: Pollie Meyer

## 2023-12-31 DIAGNOSIS — M9901 Segmental and somatic dysfunction of cervical region: Secondary | ICD-10-CM | POA: Diagnosis not present

## 2023-12-31 DIAGNOSIS — M5033 Other cervical disc degeneration, cervicothoracic region: Secondary | ICD-10-CM | POA: Diagnosis not present

## 2023-12-31 DIAGNOSIS — M5032 Other cervical disc degeneration, mid-cervical region, unspecified level: Secondary | ICD-10-CM | POA: Diagnosis not present

## 2023-12-31 DIAGNOSIS — M531 Cervicobrachial syndrome: Secondary | ICD-10-CM | POA: Diagnosis not present

## 2024-01-01 ENCOUNTER — Encounter: Payer: Self-pay | Admitting: "Endocrinology

## 2024-01-01 ENCOUNTER — Ambulatory Visit: Payer: BC Managed Care – PPO | Admitting: "Endocrinology

## 2024-01-01 VITALS — BP 136/62 | HR 73 | Resp 20 | Ht 69.0 in | Wt 191.6 lb

## 2024-01-01 DIAGNOSIS — E782 Mixed hyperlipidemia: Secondary | ICD-10-CM | POA: Diagnosis not present

## 2024-01-01 DIAGNOSIS — Z7984 Long term (current) use of oral hypoglycemic drugs: Secondary | ICD-10-CM

## 2024-01-01 DIAGNOSIS — Z7985 Long-term (current) use of injectable non-insulin antidiabetic drugs: Secondary | ICD-10-CM

## 2024-01-01 DIAGNOSIS — E11649 Type 2 diabetes mellitus with hypoglycemia without coma: Secondary | ICD-10-CM

## 2024-01-01 MED ORDER — TIRZEPATIDE 7.5 MG/0.5ML ~~LOC~~ SOAJ: 7.5000 mg | SUBCUTANEOUS | 1 refills | Status: DC

## 2024-01-01 NOTE — Patient Instructions (Addendum)
Glucerna protein shake   Will recommend the following: Metformin XR 500 mg 4 pills a day Jardiance 25 mg every day   Glimepiride 4 mg once a day Mounjaro 7.5 mg/week

## 2024-01-01 NOTE — Progress Notes (Signed)
 Outpatient Endocrinology Note Francis Birmingham, MD  01/01/24   Francis Silva Mar 10, 1961 [id number]  Referring Provider: Morgan Drivers, MD Primary Care Provider: Morgan Drivers, MD Reason for consultation: Subjective   Assessment & Plan  Diagnoses and all orders for this visit:  Uncontrolled type 2 diabetes mellitus with hypoglycemia without coma (HCC) -     Lipid panel  Long term (current) use of oral hypoglycemic drugs  Long-term (current) use of injectable non-insulin antidiabetic drugs  Mixed hypercholesterolemia and hypertriglyceridemia  Other orders -     tirzepatide  (MOUNJARO ) 7.5 MG/0.5ML Pen; Inject 7.5 mg into the skin once a week.   Diabetes Type II complicated by neuropathy, No results found for: GFR Hba1c goal less than 7, current Hba1c is  Lab Results  Component Value Date   HGBA1C 9.4 (H) 10/23/2023   Will recommend the following: Doesn't want insulin Metformin XR 500 mg 4 pills a day Jardiance 25 mg every day   Glimepiride  4 mg qd Mounjaro  7.5 mg/week  DexCom was denied   Stopped Januvia 100 mg every day   No known contraindications/side effects to any of above medications No history of MEN syndrome/medullary thyroid  cancer/pancreatitis or pancreatic cancer in self or family  -Last LD and Tg are as follows: 08/2023 LDL 86, Tg 302  Lab Results  Component Value Date   Francis Silva  10/23/2023     Comment:     . LDL cholesterol not calculated. Triglyceride levels greater than 400 mg/dL invalidate calculated LDL results. . Reference range: <100 . Desirable range <100 mg/dL for primary prevention;   <70 mg/dL for patients with CHD or diabetic patients  with > or = 2 CHD risk factors. Francis Silva is now calculated using the Martin-Hopkins  calculation, which is a validated novel method providing  better accuracy than the Friedewald equation in the  estimation of Silva.  Francis Silva et al. Francis Silva. 7986;689(80): 2061-2068   (http://education.[facility].com/faq/FAQ164)     Lab Results  Component Value Date   TRIG 424 (H) 10/23/2023   -On rosuvastatin  5 mg every day, fenofibrate 160 mg every day  -Follow low fat diet and exercise   -Blood pressure goal <140/90 - Microalbumin/creatinine goal is < 30 -Last MA/Cr is as follows: Lab Results  Component Value Date   MICROALBUR 9.2 10/23/2023   -on ACE/ARB Benazepril 20 mg qd -diet changes including salt restriction -limit eating outside -counseled BP targets per standards of diabetes care -uncontrolled blood pressure can lead to retinopathy, nephropathy and cardiovascular and atherosclerotic heart disease  Reviewed and counseled on: -A1C target -Blood sugar targets -Complications of uncontrolled diabetes  -Checking blood sugar before meals and bedtime and bring log next visit -All medications with mechanism of action and side effects -Hypoglycemia management: rule of 15's, Glucagon Emergency Kit and medical alert ID -low-carb low-fat plate-method diet -At least 20 minutes of physical activity per day -Annual dilated retinal eye exam and foot exam -compliance and follow up needs -follow up as scheduled or earlier if problem gets worse  Call if blood sugar is less than 70 or consistently above 250    Take a 15 gm snack of carbohydrate at bedtime before you go to sleep if your blood sugar is less than 100.    If you are going to fast after midnight for a test or procedure, ask your physician for instructions on how to reduce/decrease your insulin dose.    Call if blood sugar is less than 70 or consistently above 250  -  Treating a low sugar by rule of 15  (15 gms of sugar every 15 min until sugar is more than 70) If you feel your sugar is low, test your sugar to be sure If your sugar is low (less than 70), then take 15 grams of a fast acting Carbohydrate (3-4 glucose tablets or glucose gel or 4 ounces of juice or regular soda) Recheck your  sugar 15 min after treating low to make sure it is more than 70 If sugar is still less than 70, treat again with 15 grams of carbohydrate          Don't drive the hour of hypoglycemia  If unconscious/unable to eat or drink by mouth, use glucagon injection or nasal spray baqsimi and call 911. Can repeat again in 15 min if still unconscious.  Return in about 2 months (around 02/29/2024) for visit and 8 am labs before next visit.  I have reviewed current medications, nurse's notes, allergies, vital signs, past medical and surgical history, family medical history, and social history for this encounter. Counseled patient on symptoms, examination findings, lab findings, imaging results, treatment decisions and monitoring and prognosis. The patient understood the recommendations and agrees with the treatment plan. All questions regarding treatment plan were fully answered.  Francis Birmingham, MD  01/01/24   History of Present Illness Francis Silva is a 62 y.o. year old male who presents for follow up of Type II diabetes mellitus.  Francis Silva was first diagnosed in 1999.   Diabetes education +  Home diabetes regimen: Metformin XR 500 mg 4 pills a day  Glimepiride  4 mg bid Mounjaro  5 mg/week   COMPLICATIONS -  MI/Stroke -  retinopathy +  neuropathy -  nephropathy  BLOOD SUGAR DATA Checks bid before/after d 59-170   Physical Exam  BP 136/62 (BP Location: Left Arm, Patient Position: Sitting, Cuff Size: Normal)   Pulse 73   Resp 20   Ht 5' 9 (1.753 m)   Wt 191 lb 9.6 oz (86.9 kg)   SpO2 97%   BMI 28.29 kg/m    Constitutional: well developed, well nourished Head: normocephalic, atraumatic Eyes: sclera anicteric, no redness Neck: supple Lungs: normal respiratory effort Neurology: alert and oriented Skin: dry, no appreciable rashes Musculoskeletal: no appreciable defects Psychiatric: normal mood and affect Diabetic Foot Exam - Simple   No data filed      Current  Medications Patient's Medications  New Prescriptions   TIRZEPATIDE  (MOUNJARO ) 7.5 MG/0.5ML PEN    Inject 7.5 mg into the skin once a week.  Previous Medications   AMLODIPINE (NORVASC) 10 MG TABLET    TK 1 T PO QD   ASCORBIC ACID (VITAMIN C) 1000 MG TABLET    Take 1,000 mg by mouth daily.   ASPIRIN EC 81 MG TABLET    Take 81 mg by mouth daily.   BAYER CONTOUR NEXT TEST TEST STRIP    CHECK BLOOD SUGAR ONCE DAILY   BENAZEPRIL-HYDROCHLORTHIAZIDE (LOTENSIN HCT) 20-12.5 MG TABLET    TK 2 TS PO QD   CHOLECALCIFEROL (VITAMIN D PO)    Take 2,000 Units by mouth.    FENOFIBRATE 160 MG TABLET    1 tablet Orally Once a day for 90 days   GLIMEPIRIDE  (AMARYL ) 4 MG TABLET    TK 1 T PO BID WC   JARDIANCE 25 MG TABS TABLET    Take 25 mg by mouth daily.   METFORMIN (GLUCOPHAGE-XR) 500 MG 24 HR TABLET  TK 4 TS PO QAM   METOPROLOL SUCCINATE (TOPROL-XL) 100 MG 24 HR TABLET    TK 1 T PO BID   POTASSIUM CHLORIDE (MICRO-K) 10 MEQ CR CAPSULE    TK 1 C PO QD WF   ROSUVASTATIN  (CRESTOR ) 5 MG TABLET    Take 5 mg by mouth daily.  Modified Medications   No medications on file  Discontinued Medications   TIRZEPATIDE  (MOUNJARO ) 5 MG/0.5ML PEN    Inject 5 mg into the skin once a week.    Allergies No Known Allergies  Past Medical History Past Medical History:  Diagnosis Date   Diabetes (HCC)    HTN (hypertension)    Hypokalemia     Past Surgical History Past Surgical History:  Procedure Laterality Date   COLONOSCOPY  2018    Family History family history includes Diabetes in his sister; Healthy in his sister; Heart attack in his mother; Heart disease in his father and mother; Thyroid  disease in his sister.  Social History Social History   Socioeconomic History   Marital status: Married    Spouse name: Not on file   Number of children: Not on file   Years of education: Not on file   Highest education level: Not on file  Occupational History   Not on file  Tobacco Use   Smoking status: Never    Smokeless tobacco: Never  Vaping Use   Vaping status: Never Used  Substance and Sexual Activity   Alcohol use: Yes    Comment: Social - weekly   Drug use: No   Sexual activity: Yes  Other Topics Concern   Not on file  Social History Narrative   Not on file   Social Silva of Health   Financial Resource Strain: Not on file  Food Insecurity: Not on file  Transportation Needs: Not on file  Physical Activity: Not on file  Stress: Not on file  Social Connections: Not on file  Intimate Partner Violence: Not on file    Lab Results  Component Value Date   HGBA1C 9.4 (H) 10/23/2023   Lab Results  Component Value Date   CHOL 195 10/23/2023   Lab Results  Component Value Date   HDL 32 (L) 10/23/2023   Lab Results  Component Value Date   [facility]  10/23/2023     Comment:     . LDL cholesterol not calculated. Triglyceride levels greater than 400 mg/dL invalidate calculated LDL results. . Reference range: <100 . Desirable range <100 mg/dL for primary prevention;   <70 mg/dL for patients with CHD or diabetic patients  with > or = 2 CHD risk factors. Francis Silva is now calculated using the Martin-Hopkins  calculation, which is a validated novel method providing  better accuracy than the Friedewald equation in the  estimation of Silva.  Francis Silva et al. Francis Silva. 7986;689(80): 2061-2068  (http://education.[facility].com/faq/FAQ164)    Lab Results  Component Value Date   TRIG 424 (H) 10/23/2023   Lab Results  Component Value Date   CHOLHDL 6.1 (H) 10/23/2023   Lab Results  Component Value Date   CREATININE 1.20 01/02/2018   No results found for: GFR Lab Results  Component Value Date   MICROALBUR 9.2 10/23/2023      Component Value Date/Time   NA 136 01/02/2018 1224   NA 138 08/06/2017 1144   K 3.5 01/02/2018 1224   K 3.6 08/06/2017 1144   CL 97 (L) 01/02/2018 1224   CO2 26 01/02/2018 1224  CO2 30 (H) 08/06/2017 1144   GLUCOSE 291 (H) 01/02/2018 1224    GLUCOSE 262 (H) 08/06/2017 1144   BUN 15 01/02/2018 1224   BUN 13.1 08/06/2017 1144   CREATININE 1.20 01/02/2018 1224   CREATININE 1.1 08/06/2017 1144   CALCIUM  9.5 01/02/2018 1224   CALCIUM  10.1 08/06/2017 1144   PROT 7.4 01/02/2018 1224   PROT 8.0 08/06/2017 1144   PROT 7.4 08/06/2017 1144   ALBUMIN 4.1 01/02/2018 1224   ALBUMIN 4.3 08/06/2017 1144   AST 74 (H) 01/02/2018 1224   AST 48 (H) 08/06/2017 1144   ALT 143 (H) 01/02/2018 1224   ALT 99 (H) 08/06/2017 1144   ALKPHOS 72 01/02/2018 1224   ALKPHOS 63 08/06/2017 1144   BILITOT 0.7 01/02/2018 1224   BILITOT 1.16 08/06/2017 1144   GFRNONAA >60 01/02/2018 1224   GFRAA >60 01/02/2018 1224      Latest Ref Rng & Units 01/02/2018   12:24 PM 08/06/2017   11:44 AM  BMP  Glucose 70 - 140 mg/dL 708  737   BUN 7 - 26 mg/dL 15  86.8   Creatinine 9.29 - 1.30 mg/dL 8.79  1.1   Sodium 863 - 145 mmol/L 136  138   Potassium 3.5 - 5.1 mmol/L 3.5  3.6   Chloride 98 - 109 mmol/L 97    CO2 22 - 29 mmol/L 26  30   Calcium  8.4 - 10.4 mg/dL 9.5  89.8        Component Value Date/Time   WBC 7.3 01/02/2018 1224   WBC 7.9 08/06/2017 1144   WBC 7.0 11/02/2008 0905   RBC 4.86 01/02/2018 1224   RBC 4.86 01/02/2018 1224   HGB 14.9 01/02/2018 1224   HGB 15.0 08/06/2017 1144   HCT 41.9 01/02/2018 1224   HCT 42.8 08/06/2017 1144   PLT 188 01/02/2018 1224   PLT 180 08/06/2017 1144   MCV 86.2 01/02/2018 1224   MCV 86.1 08/06/2017 1144   MCH 30.7 01/02/2018 1224   MCHC 35.6 01/02/2018 1224   RDW 11.9 01/02/2018 1224   RDW 12.2 08/06/2017 1144   LYMPHSABS 1.7 01/02/2018 1224   LYMPHSABS 1.4 08/06/2017 1144   MONOABS 0.5 01/02/2018 1224   MONOABS 0.9 08/06/2017 1144   EOSABS 0.3 01/02/2018 1224   EOSABS 0.2 08/06/2017 1144   BASOSABS 0.0 01/02/2018 1224   BASOSABS 0.0 08/06/2017 1144     Parts of this note may have been dictated using voice recognition software. There may be variances in spelling and vocabulary which are unintentional.  Not all errors are proofread. Please notify the dino if any discrepancies are noted or if the meaning of any statement is not clear.

## 2024-01-02 ENCOUNTER — Other Ambulatory Visit: Payer: Self-pay

## 2024-01-02 NOTE — Telephone Encounter (Signed)
**Note De-identified  [place] Obfuscation** Please advise 

## 2024-01-28 DIAGNOSIS — M9901 Segmental and somatic dysfunction of cervical region: Secondary | ICD-10-CM | POA: Diagnosis not present

## 2024-01-28 DIAGNOSIS — M5033 Other cervical disc degeneration, cervicothoracic region: Secondary | ICD-10-CM | POA: Diagnosis not present

## 2024-01-28 DIAGNOSIS — M5032 Other cervical disc degeneration, mid-cervical region, unspecified level: Secondary | ICD-10-CM | POA: Diagnosis not present

## 2024-01-28 DIAGNOSIS — M531 Cervicobrachial syndrome: Secondary | ICD-10-CM | POA: Diagnosis not present

## 2024-02-14 ENCOUNTER — Other Ambulatory Visit: Payer: Self-pay

## 2024-02-14 DIAGNOSIS — E1165 Type 2 diabetes mellitus with hyperglycemia: Secondary | ICD-10-CM

## 2024-02-14 DIAGNOSIS — Z794 Long term (current) use of insulin: Secondary | ICD-10-CM

## 2024-02-22 ENCOUNTER — Other Ambulatory Visit: Payer: BC Managed Care – PPO

## 2024-02-22 DIAGNOSIS — E1165 Type 2 diabetes mellitus with hyperglycemia: Secondary | ICD-10-CM | POA: Diagnosis not present

## 2024-02-22 DIAGNOSIS — E11649 Type 2 diabetes mellitus with hypoglycemia without coma: Secondary | ICD-10-CM | POA: Diagnosis not present

## 2024-02-22 DIAGNOSIS — Z794 Long term (current) use of insulin: Secondary | ICD-10-CM | POA: Diagnosis not present

## 2024-02-22 LAB — COMPREHENSIVE METABOLIC PANEL WITH GFR
AG Ratio: 2.7 (calc) — ABNORMAL HIGH (ref 1.0–2.5)
ALT: 31 U/L (ref 9–46)
AST: 23 U/L (ref 10–35)
Albumin: 5.1 g/dL (ref 3.6–5.1)
Alkaline phosphatase (APISO): 46 U/L (ref 35–144)
BUN: 18 mg/dL (ref 7–25)
CO2: 28 mmol/L (ref 20–32)
Calcium: 10.2 mg/dL (ref 8.6–10.3)
Chloride: 103 mmol/L (ref 98–110)
Creat: 1.16 mg/dL (ref 0.70–1.35)
Globulin: 1.9 g/dL (ref 1.9–3.7)
Glucose, Bld: 76 mg/dL (ref 65–99)
Potassium: 3.8 mmol/L (ref 3.5–5.3)
Sodium: 140 mmol/L (ref 135–146)
Total Bilirubin: 0.7 mg/dL (ref 0.2–1.2)
Total Protein: 7 g/dL (ref 6.1–8.1)
eGFR: 72 mL/min/{1.73_m2} (ref >=60)

## 2024-02-22 LAB — LIPID PANEL
Cholesterol: 113 mg/dL (ref <200)
HDL: 38 mg/dL — ABNORMAL LOW (ref >=40)
LDL Cholesterol (Calc): 61 mg/dL
Non-HDL Cholesterol (Calc): 75 mg/dL (ref <130)
Total CHOL/HDL Ratio: 3 (calc) (ref <5.0)
Triglycerides: 61 mg/dL (ref <150)

## 2024-02-25 DIAGNOSIS — E11319 Type 2 diabetes mellitus with unspecified diabetic retinopathy without macular edema: Secondary | ICD-10-CM | POA: Diagnosis not present

## 2024-02-25 LAB — HM DIABETES EYE EXAM

## 2024-02-26 DIAGNOSIS — M531 Cervicobrachial syndrome: Secondary | ICD-10-CM | POA: Diagnosis not present

## 2024-02-26 DIAGNOSIS — M5033 Other cervical disc degeneration, cervicothoracic region: Secondary | ICD-10-CM | POA: Diagnosis not present

## 2024-02-26 DIAGNOSIS — M5032 Other cervical disc degeneration, mid-cervical region, unspecified level: Secondary | ICD-10-CM | POA: Diagnosis not present

## 2024-02-26 DIAGNOSIS — M9901 Segmental and somatic dysfunction of cervical region: Secondary | ICD-10-CM | POA: Diagnosis not present

## 2024-02-29 ENCOUNTER — Ambulatory Visit: Payer: BC Managed Care – PPO | Admitting: "Endocrinology

## 2024-02-29 ENCOUNTER — Encounter: Payer: Self-pay | Admitting: "Endocrinology

## 2024-02-29 VITALS — BP 122/80 | HR 86 | Ht 69.0 in | Wt 186.0 lb

## 2024-02-29 DIAGNOSIS — Z7984 Long term (current) use of oral hypoglycemic drugs: Secondary | ICD-10-CM | POA: Diagnosis not present

## 2024-02-29 DIAGNOSIS — Z7985 Long-term (current) use of injectable non-insulin antidiabetic drugs: Secondary | ICD-10-CM | POA: Diagnosis not present

## 2024-02-29 DIAGNOSIS — E782 Mixed hyperlipidemia: Secondary | ICD-10-CM | POA: Diagnosis not present

## 2024-02-29 DIAGNOSIS — E1140 Type 2 diabetes mellitus with diabetic neuropathy, unspecified: Secondary | ICD-10-CM | POA: Diagnosis not present

## 2024-02-29 LAB — POCT GLYCOSYLATED HEMOGLOBIN (HGB A1C): Hemoglobin A1C: 6.5 % — AB (ref 4.0–5.6)

## 2024-02-29 MED ORDER — TIRZEPATIDE 10 MG/0.5ML ~~LOC~~ SOAJ
10.0000 mg | SUBCUTANEOUS | 0 refills | Status: DC
Start: 2024-02-29 — End: 2024-06-03

## 2024-02-29 NOTE — Patient Instructions (Signed)

## 2024-02-29 NOTE — Progress Notes (Signed)
 Outpatient Endocrinology Note Francis Harold, MD  02/29/24   Francis Silva December 21, 1960 [id number]  Referring Provider: Catha Gosselin, MD Primary Care Provider: Catha Gosselin, MD Reason for consultation: Subjective   Assessment & Plan  Diagnoses and all orders for this visit:  Type 2 diabetes mellitus with diabetic neuropathy, without long-term current use of insulin (HCC) -     POCT glycosylated hemoglobin (Hb A1C) -     tirzepatide (MOUNJARO) 10 MG/0.5ML Pen; Inject 10 mg into the skin once a week.  Long term (current) use of oral hypoglycemic drugs  Long-term (current) use of injectable non-insulin antidiabetic drugs  Mixed hypercholesterolemia and hypertriglyceridemia   Diabetes Type II complicated by neuropathy, No results found for: "GFR" Hba1c goal less than 7, current Hba1c is  Lab Results  Component Value Date   HGBA1C 6.5 (A) 02/29/2024   Will recommend the following: Doesn't want insulin Metformin XR 500 mg 4 pills a day Jardiance 25 mg every day   Glimepiride 4 mg every day PRN (only when BG >250) Mounjaro 10 mg/week  DexCom was denied   Stopped Januvia 100 mg every day   No known contraindications/side effects to any of above medications No history of MEN syndrome/medullary thyroid cancer/pancreatitis or pancreatic cancer in self or family  -Last LD and Tg are as follows: 08/2023 LDL 86, Tg 302  Lab Results  Component Value Date   LDLCALC 61 02/22/2024    Lab Results  Component Value Date   TRIG 61 02/22/2024   -On rosuvastatin 5 mg every day, fenofibrate 160 mg every day, fish oil 3 pills a day  -Follow low fat diet and exercise   -Blood pressure goal <140/90 - Microalbumin/creatinine goal is < 30 -Last MA/Cr is as follows: Lab Results  Component Value Date   MICROALBUR 9.2 10/23/2023   -on ACE/ARB Benazepril 20 mg qd -diet changes including salt restriction -limit eating outside -counseled BP targets per standards of diabetes  care -uncontrolled blood pressure can lead to retinopathy, nephropathy and cardiovascular and atherosclerotic heart disease  Reviewed and counseled on: -A1C target -Blood sugar targets -Complications of uncontrolled diabetes  -Checking blood sugar before meals and bedtime and bring log next visit -All medications with mechanism of action and side effects -Hypoglycemia management: rule of 15's, Glucagon Emergency Kit and medical alert ID -low-carb low-fat plate-method diet -At least 20 minutes of physical activity per day -Annual dilated retinal eye exam and foot exam -compliance and follow up needs -follow up as scheduled or earlier if problem gets worse  Call if blood sugar is less than 70 or consistently above 250    Take a 15 gm snack of carbohydrate at bedtime before you go to sleep if your blood sugar is less than 100.    If you are going to fast after midnight for a test or procedure, ask your physician for instructions on how to reduce/decrease your insulin dose.    Call if blood sugar is less than 70 or consistently above 250  -Treating a low sugar by rule of 15  (15 gms of sugar every 15 min until sugar is more than 70) If you feel your sugar is low, test your sugar to be sure If your sugar is low (less than 70), then take 15 grams of a fast acting Carbohydrate (3-4 glucose tablets or glucose gel or 4 ounces of juice or regular soda) Recheck your sugar 15 min after treating low to make sure it is more than  70 If sugar is still less than 70, treat again with 15 grams of carbohydrate          Don't drive the hour of hypoglycemia  If unconscious/unable to eat or drink by mouth, use glucagon injection or nasal spray baqsimi and call 911. Can repeat again in 15 min if still unconscious.  Return in about 3 months (around 05/30/2024).  I have reviewed current medications, nurse's notes, allergies, vital signs, past medical and surgical history, family medical history, and social  history for this encounter. Counseled patient on symptoms, examination findings, lab findings, imaging results, treatment decisions and monitoring and prognosis. The patient understood the recommendations and agrees with the treatment plan. All questions regarding treatment plan were fully answered.  Francis Chico, MD  02/29/24   History of Present Illness Francis Silva is a 63 y.o. year old male who presents for follow up of Type II diabetes mellitus.  Francis Silva was first diagnosed in 1999.   Diabetes education +  Home diabetes regimen: Metformin XR 500 mg 4 pills a day  Glimepiride 4 mg bid Mounjaro 5 mg/week   COMPLICATIONS -  MI/Stroke -  retinopathy +  neuropathy -  nephropathy  BLOOD SUGAR DATA Checks bid before/after d 82-155  Physical Exam  BP 122/80   Pulse 86   Ht 5\' 9"  (1.753 m)   Wt 186 lb (84.4 kg)   SpO2 98%   BMI 27.47 kg/m    Constitutional: well developed, well nourished Head: normocephalic, atraumatic Eyes: sclera anicteric, no redness Neck: supple Lungs: normal respiratory effort Neurology: alert and oriented Skin: dry, no appreciable rashes Musculoskeletal: no appreciable defects Psychiatric: normal mood and affect Diabetic Foot Exam - Simple   No data filed      Current Medications Patient's Medications  New Prescriptions   TIRZEPATIDE (MOUNJARO) 10 MG/0.5ML PEN    Inject 10 mg into the skin once a week.  Previous Medications   AMLODIPINE (NORVASC) 10 MG TABLET    TK 1 T PO QD   ASCORBIC ACID (VITAMIN C) 1000 MG TABLET    Take 1,000 mg by mouth daily.   ASPIRIN EC 81 MG TABLET    Take 81 mg by mouth daily.   BAYER CONTOUR NEXT TEST TEST STRIP    CHECK BLOOD SUGAR ONCE DAILY   BENAZEPRIL-HYDROCHLORTHIAZIDE (LOTENSIN HCT) 20-12.5 MG TABLET    TK 2 TS PO QD   CHOLECALCIFEROL (VITAMIN D PO)    Take 2,000 Units by mouth.    FENOFIBRATE 160 MG TABLET    1 tablet Orally Once a day for 90 days   GLIMEPIRIDE (AMARYL) 4 MG TABLET     TK 1 T PO BID WC   JARDIANCE 25 MG TABS TABLET    Take 25 mg by mouth daily.   METFORMIN (GLUCOPHAGE-XR) 500 MG 24 HR TABLET    TK 4 TS PO QAM   METOPROLOL SUCCINATE (TOPROL-XL) 100 MG 24 HR TABLET    TK 1 T PO BID   POTASSIUM CHLORIDE (MICRO-K) 10 MEQ CR CAPSULE    TK 1 C PO QD WF   ROSUVASTATIN (CRESTOR) 5 MG TABLET    Take 5 mg by mouth daily.  Modified Medications   No medications on file  Discontinued Medications   TIRZEPATIDE (MOUNJARO) 7.5 MG/0.5ML PEN    Inject 7.5 mg into the skin once a week.    Allergies No Known Allergies  Past Medical History Past Medical History:  Diagnosis Date  Diabetes (HCC)    HTN (hypertension)    Hypokalemia     Past Surgical History Past Surgical History:  Procedure Laterality Date   COLONOSCOPY  2018    Family History family history includes Diabetes in his sister; Healthy in his sister; Heart attack in his mother; Heart disease in his father and mother; Thyroid disease in his sister.  Social History Social History   Socioeconomic History   Marital status: Married    Spouse name: Not on file   Number of children: Not on file   Years of education: Not on file   Highest education level: Not on file  Occupational History   Not on file  Tobacco Use   Smoking status: Never   Smokeless tobacco: Never  Vaping Use   Vaping status: Never Used  Substance and Sexual Activity   Alcohol use: Yes    Comment: Social - weekly   Drug use: No   Sexual activity: Yes  Other Topics Concern   Not on file  Social History Narrative   Not on file   Social Drivers of Health   Financial Resource Strain: Not on file  Food Insecurity: Not on file  Transportation Needs: Not on file  Physical Activity: Not on file  Stress: Not on file  Social Connections: Not on file  Intimate Partner Violence: Not on file    Lab Results  Component Value Date   HGBA1C 6.5 (A) 02/29/2024   HGBA1C 9.4 (H) 10/23/2023   Lab Results  Component Value Date    CHOL 113 02/22/2024   Lab Results  Component Value Date   HDL 38 (L) 02/22/2024   Lab Results  Component Value Date   LDLCALC 61 02/22/2024   Lab Results  Component Value Date   TRIG 61 02/22/2024   Lab Results  Component Value Date   CHOLHDL 3.0 02/22/2024   Lab Results  Component Value Date   CREATININE 1.16 02/22/2024   No results found for: "GFR" Lab Results  Component Value Date   MICROALBUR 9.2 10/23/2023      Component Value Date/Time   NA 140 02/22/2024 0807   NA 138 08/06/2017 1144   K 3.8 02/22/2024 0807   K 3.6 08/06/2017 1144   CL 103 02/22/2024 0807   CO2 28 02/22/2024 0807   CO2 30 (H) 08/06/2017 1144   GLUCOSE 76 02/22/2024 0807   GLUCOSE 262 (H) 08/06/2017 1144   BUN 18 02/22/2024 0807   BUN 13.1 08/06/2017 1144   CREATININE 1.16 02/22/2024 0807   CREATININE 1.1 08/06/2017 1144   CALCIUM 10.2 02/22/2024 0807   CALCIUM 10.1 08/06/2017 1144   PROT 7.0 02/22/2024 0807   PROT 8.0 08/06/2017 1144   PROT 7.4 08/06/2017 1144   ALBUMIN 4.1 01/02/2018 1224   ALBUMIN 4.3 08/06/2017 1144   AST 23 02/22/2024 0807   AST 48 (H) 08/06/2017 1144   ALT 31 02/22/2024 0807   ALT 99 (H) 08/06/2017 1144   ALKPHOS 72 01/02/2018 1224   ALKPHOS 63 08/06/2017 1144   BILITOT 0.7 02/22/2024 0807   BILITOT 1.16 08/06/2017 1144   GFRNONAA >60 01/02/2018 1224   GFRAA >60 01/02/2018 1224      Latest Ref Rng & Units 02/22/2024    8:07 AM 01/02/2018   12:24 PM 08/06/2017   11:44 AM  BMP  Glucose 65 - 99 mg/dL 76  161  096   BUN 7 - 25 mg/dL 18  15  04.5   Creatinine 0.70 -  1.35 mg/dL 0.98  1.19  1.1   BUN/Creat Ratio 6 - 22 (calc) SEE NOTE:     Sodium 135 - 146 mmol/L 140  136  138   Potassium 3.5 - 5.3 mmol/L 3.8  3.5  3.6   Chloride 98 - 110 mmol/L 103  97    CO2 20 - 32 mmol/L 28  26  30    Calcium 8.6 - 10.3 mg/dL 14.7  9.5  82.9        Component Value Date/Time   WBC 7.3 01/02/2018 1224   WBC 7.9 08/06/2017 1144   WBC 7.0 11/02/2008 0905   RBC 4.86  01/02/2018 1224   RBC 4.86 01/02/2018 1224   HGB 14.9 01/02/2018 1224   HGB 15.0 08/06/2017 1144   HCT 41.9 01/02/2018 1224   HCT 42.8 08/06/2017 1144   PLT 188 01/02/2018 1224   PLT 180 08/06/2017 1144   MCV 86.2 01/02/2018 1224   MCV 86.1 08/06/2017 1144   MCH 30.7 01/02/2018 1224   MCHC 35.6 01/02/2018 1224   RDW 11.9 01/02/2018 1224   RDW 12.2 08/06/2017 1144   LYMPHSABS 1.7 01/02/2018 1224   LYMPHSABS 1.4 08/06/2017 1144   MONOABS 0.5 01/02/2018 1224   MONOABS 0.9 08/06/2017 1144   EOSABS 0.3 01/02/2018 1224   EOSABS 0.2 08/06/2017 1144   BASOSABS 0.0 01/02/2018 1224   BASOSABS 0.0 08/06/2017 1144     Parts of this note may have been dictated using voice recognition software. There may be variances in spelling and vocabulary which are unintentional. Not all errors are proofread. Please notify the Thereasa Parkin if any discrepancies are noted or if the meaning of any statement is not clear.

## 2024-03-25 DIAGNOSIS — M5032 Other cervical disc degeneration, mid-cervical region, unspecified level: Secondary | ICD-10-CM | POA: Diagnosis not present

## 2024-03-25 DIAGNOSIS — M9901 Segmental and somatic dysfunction of cervical region: Secondary | ICD-10-CM | POA: Diagnosis not present

## 2024-03-25 DIAGNOSIS — M531 Cervicobrachial syndrome: Secondary | ICD-10-CM | POA: Diagnosis not present

## 2024-03-25 DIAGNOSIS — M5033 Other cervical disc degeneration, cervicothoracic region: Secondary | ICD-10-CM | POA: Diagnosis not present

## 2024-04-22 DIAGNOSIS — M5033 Other cervical disc degeneration, cervicothoracic region: Secondary | ICD-10-CM | POA: Diagnosis not present

## 2024-04-22 DIAGNOSIS — M531 Cervicobrachial syndrome: Secondary | ICD-10-CM | POA: Diagnosis not present

## 2024-04-22 DIAGNOSIS — M9901 Segmental and somatic dysfunction of cervical region: Secondary | ICD-10-CM | POA: Diagnosis not present

## 2024-04-22 DIAGNOSIS — M5032 Other cervical disc degeneration, mid-cervical region, unspecified level: Secondary | ICD-10-CM | POA: Diagnosis not present

## 2024-05-20 DIAGNOSIS — M5033 Other cervical disc degeneration, cervicothoracic region: Secondary | ICD-10-CM | POA: Diagnosis not present

## 2024-05-20 DIAGNOSIS — M5032 Other cervical disc degeneration, mid-cervical region, unspecified level: Secondary | ICD-10-CM | POA: Diagnosis not present

## 2024-05-20 DIAGNOSIS — M531 Cervicobrachial syndrome: Secondary | ICD-10-CM | POA: Diagnosis not present

## 2024-05-20 DIAGNOSIS — M9901 Segmental and somatic dysfunction of cervical region: Secondary | ICD-10-CM | POA: Diagnosis not present

## 2024-05-29 ENCOUNTER — Other Ambulatory Visit

## 2024-05-29 DIAGNOSIS — E11649 Type 2 diabetes mellitus with hypoglycemia without coma: Secondary | ICD-10-CM | POA: Diagnosis not present

## 2024-05-29 LAB — LIPID PANEL
Cholesterol: 136 mg/dL (ref <200)
HDL: 35 mg/dL — ABNORMAL LOW (ref >=40)
LDL Cholesterol (Calc): 79 mg/dL
Non-HDL Cholesterol (Calc): 101 mg/dL (ref <130)
Total CHOL/HDL Ratio: 3.9 (calc) (ref <5.0)
Triglycerides: 128 mg/dL (ref <150)

## 2024-06-02 ENCOUNTER — Encounter: Payer: Self-pay | Admitting: "Endocrinology

## 2024-06-02 ENCOUNTER — Ambulatory Visit: Admitting: "Endocrinology

## 2024-06-02 VITALS — BP 132/84 | HR 64 | Ht 69.0 in | Wt 186.0 lb

## 2024-06-02 DIAGNOSIS — Z7984 Long term (current) use of oral hypoglycemic drugs: Secondary | ICD-10-CM

## 2024-06-02 DIAGNOSIS — E782 Mixed hyperlipidemia: Secondary | ICD-10-CM

## 2024-06-02 DIAGNOSIS — Z7985 Long-term (current) use of injectable non-insulin antidiabetic drugs: Secondary | ICD-10-CM

## 2024-06-02 DIAGNOSIS — E1140 Type 2 diabetes mellitus with diabetic neuropathy, unspecified: Secondary | ICD-10-CM

## 2024-06-02 LAB — POCT GLYCOSYLATED HEMOGLOBIN (HGB A1C): Hemoglobin A1C: 6.3 % — AB (ref 4.0–5.6)

## 2024-06-02 MED ORDER — ROSUVASTATIN CALCIUM 10 MG PO TABS: 10.0000 mg | ORAL_TABLET | Freq: Every day | ORAL | 3 refills | Status: AC

## 2024-06-02 NOTE — Progress Notes (Signed)
 Outpatient Endocrinology Note Francis Birmingham, MD  06/02/24   Francis Silva 08-Nov-1961 [id number]  Referring Provider: Morgan Drivers, MD Primary Care Provider: Morgan Drivers, MD Reason for consultation: Subjective   Assessment & Plan  Diagnoses and all orders for this visit:  Type 2 diabetes mellitus with diabetic neuropathy, without long-term current use of insulin (HCC) -     POCT glycosylated hemoglobin (Hb A1C)  Long term (current) use of oral hypoglycemic drugs  Long-term (current) use of injectable non-insulin antidiabetic drugs  Mixed hypercholesterolemia and hypertriglyceridemia  Other orders -     rosuvastatin  (CRESTOR ) 10 MG tablet; Take 1 tablet (10 mg total) by mouth daily.   Diabetes Type II complicated by neuropathy, No results found for: GFR Hba1c goal less than 7, current Hba1c is  Lab Results  Component Value Date   HGBA1C 6.3 (A) 06/02/2024   Will recommend the following: Doesn't want insulin Metformin XR 500 mg 4 pills a day Jardiance 25 mg every day   Mounjaro  10 mg/week (doesn't want to lose more weight)  DexCom was denied   Glimepiride 4 mg every day PRN (only when BG >250) Stopped Januvia 100 mg every day   No known contraindications/side effects to any of above medications No history of MEN syndrome/medullary thyroid  cancer/pancreatitis or pancreatic cancer in self or family  -Last LD and Tg are as follows: 08/2023 LDL 86, Tg 302  Lab Results  Component Value Date   LDLCALC 79 05/29/2024    Lab Results  Component Value Date   TRIG 128 05/29/2024   -On rosuvastatin  5 mg every day, fenofibrate 160 mg every day, fish oil 3 pills a day  -Follow low fat diet and exercise   -Blood pressure goal <140/90 - Microalbumin/creatinine goal is < 30 -Last MA/Cr is as follows: Lab Results  Component Value Date   MICROALBUR 9.2 10/23/2023   -on ACE/ARB Benazepril 20 mg bid -diet changes including salt restriction -limit eating  outside -counseled BP targets per standards of diabetes care -uncontrolled blood pressure can lead to retinopathy, nephropathy and cardiovascular and atherosclerotic heart disease  Reviewed and counseled on: -A1C target -Blood sugar targets -Complications of uncontrolled diabetes  -Checking blood sugar before meals and bedtime and bring log next visit -All medications with mechanism of action and side effects -Hypoglycemia management: rule of 15's, Glucagon Emergency Kit and medical alert ID -low-carb low-fat plate-method diet -At least 20 minutes of physical activity per day -Annual dilated retinal eye exam and foot exam -compliance and follow up needs -follow up as scheduled or earlier if problem gets worse  Call if blood sugar is less than 70 or consistently above 250    Take a 15 gm snack of carbohydrate at bedtime before you go to sleep if your blood sugar is less than 100.    If you are going to fast after midnight for a test or procedure, ask your physician for instructions on how to reduce/decrease your insulin dose.    Call if blood sugar is less than 70 or consistently above 250  -Treating a low sugar by rule of 15  (15 gms of sugar every 15 min until sugar is more than 70) If you feel your sugar is low, test your sugar to be sure If your sugar is low (less than 70), then take 15 grams of a fast acting Carbohydrate (3-4 glucose tablets or glucose gel or 4 ounces of juice or regular soda) Recheck your sugar 15 min after  treating low to make sure it is more than 70 If sugar is still less than 70, treat again with 15 grams of carbohydrate          Don't drive the hour of hypoglycemia  If unconscious/unable to eat or drink by mouth, use glucagon injection or nasal spray baqsimi and call 911. Can repeat again in 15 min if still unconscious.  No follow-ups on file.  I have reviewed current medications, nurse's notes, allergies, vital signs, past medical and surgical history,  family medical history, and social history for this encounter. Counseled patient on symptoms, examination findings, lab findings, imaging results, treatment decisions and monitoring and prognosis. The patient understood the recommendations and agrees with the treatment plan. All questions regarding treatment plan were fully answered.  Francis Birmingham, MD  06/02/24   History of Present Illness Francis Silva is a 63 y.o. year old male who presents for follow up of Type II diabetes mellitus.  Francis Silva was first diagnosed in 1999.   Diabetes education +  Home diabetes regimen: Metformin XR 500 mg 4 pills a day  Mounjaro  10 mg/week  Jardiance 25 mg every day   Glimepiride 4 mg every day PRN (only when BG >250)  COMPLICATIONS -  MI/Stroke -  retinopathy +  neuropathy -  nephropathy  BLOOD SUGAR DATA Checks bid before/after d 84-161  Physical Exam  BP 132/84   Pulse 64   Ht 5' 9 (1.753 m)   Wt 186 lb (84.4 kg)   SpO2 98%   BMI 27.47 kg/m    Constitutional: well developed, well nourished Head: normocephalic, atraumatic Eyes: sclera anicteric, no redness Neck: supple Lungs: normal respiratory effort Neurology: alert and oriented Skin: dry, no appreciable rashes Musculoskeletal: no appreciable defects Psychiatric: normal mood and affect Diabetic Foot Exam - Simple   No data filed      Current Medications Patient's Medications  New Prescriptions   ROSUVASTATIN  (CRESTOR ) 10 MG TABLET    Take 1 tablet (10 mg total) by mouth daily.  Previous Medications   AMLODIPINE (NORVASC) 10 MG TABLET    TK 1 T PO QD   ASCORBIC ACID (VITAMIN C) 1000 MG TABLET    Take 1,000 mg by mouth daily.   ASPIRIN EC 81 MG TABLET    Take 81 mg by mouth daily.   BAYER CONTOUR NEXT TEST TEST STRIP    CHECK BLOOD SUGAR ONCE DAILY   BENAZEPRIL-HYDROCHLORTHIAZIDE (LOTENSIN HCT) 20-12.5 MG TABLET    TK 2 TS PO QD   CHOLECALCIFEROL (VITAMIN D PO)    Take 2,000 Units by mouth.     FENOFIBRATE 160 MG TABLET    1 tablet Orally Once a day for 90 days   GLIMEPIRIDE (AMARYL) 4 MG TABLET    TK 1 T PO BID WC   JARDIANCE 25 MG TABS TABLET    Take 25 mg by mouth daily.   METFORMIN (GLUCOPHAGE-XR) 500 MG 24 HR TABLET    TK 4 TS PO QAM   METOPROLOL SUCCINATE (TOPROL-XL) 100 MG 24 HR TABLET    TK 1 T PO BID   POTASSIUM CHLORIDE (MICRO-K) 10 MEQ CR CAPSULE    TK 1 C PO QD WF   TIRZEPATIDE  (MOUNJARO ) 10 MG/0.5ML PEN    Inject 10 mg into the skin once a week.  Modified Medications   No medications on file  Discontinued Medications   ROSUVASTATIN  (CRESTOR ) 5 MG TABLET    Take 5 mg by mouth daily.  Allergies No Known Allergies  Past Medical History Past Medical History:  Diagnosis Date   Diabetes (HCC)    HTN (hypertension)    Hypokalemia     Past Surgical History Past Surgical History:  Procedure Laterality Date   COLONOSCOPY  2018    Family History family history includes Diabetes in his sister; Healthy in his sister; Heart attack in his mother; Heart disease in his father and mother; Thyroid  disease in his sister.  Social History Social History   Socioeconomic History   Marital status: Married    Spouse name: Not on file   Number of children: Not on file   Years of education: Not on file   Highest education level: Not on file  Occupational History   Not on file  Tobacco Use   Smoking status: Never   Smokeless tobacco: Never  Vaping Use   Vaping status: Never Used  Substance and Sexual Activity   Alcohol use: Yes    Comment: Social - weekly   Drug use: No   Sexual activity: Yes  Other Topics Concern   Not on file  Social History Narrative   Not on file   Social Drivers of Health   Financial Resource Strain: Not on file  Food Insecurity: Not on file  Transportation Needs: Not on file  Physical Activity: Not on file  Stress: Not on file  Social Connections: Not on file  Intimate Partner Violence: Not on file    Lab Results  Component  Value Date   HGBA1C 6.3 (A) 06/02/2024   HGBA1C 6.5 (A) 02/29/2024   HGBA1C 9.4 (H) 10/23/2023   Lab Results  Component Value Date   CHOL 136 05/29/2024   Lab Results  Component Value Date   HDL 35 (L) 05/29/2024   Lab Results  Component Value Date   LDLCALC 79 05/29/2024   Lab Results  Component Value Date   TRIG 128 05/29/2024   Lab Results  Component Value Date   CHOLHDL 3.9 05/29/2024   Lab Results  Component Value Date   CREATININE 1.16 02/22/2024   No results found for: GFR Lab Results  Component Value Date   MICROALBUR 9.2 10/23/2023      Component Value Date/Time   NA 140 02/22/2024 0807   NA 138 08/06/2017 1144   K 3.8 02/22/2024 0807   K 3.6 08/06/2017 1144   CL 103 02/22/2024 0807   CO2 28 02/22/2024 0807   CO2 30 (H) 08/06/2017 1144   GLUCOSE 76 02/22/2024 0807   GLUCOSE 262 (H) 08/06/2017 1144   BUN 18 02/22/2024 0807   BUN 13.1 08/06/2017 1144   CREATININE 1.16 02/22/2024 0807   CREATININE 1.1 08/06/2017 1144   CALCIUM  10.2 02/22/2024 0807   CALCIUM  10.1 08/06/2017 1144   PROT 7.0 02/22/2024 0807   PROT 8.0 08/06/2017 1144   PROT 7.4 08/06/2017 1144   ALBUMIN 4.1 01/02/2018 1224   ALBUMIN 4.3 08/06/2017 1144   AST 23 02/22/2024 0807   AST 48 (H) 08/06/2017 1144   ALT 31 02/22/2024 0807   ALT 99 (H) 08/06/2017 1144   ALKPHOS 72 01/02/2018 1224   ALKPHOS 63 08/06/2017 1144   BILITOT 0.7 02/22/2024 0807   BILITOT 1.16 08/06/2017 1144   GFRNONAA >60 01/02/2018 1224   GFRAA >60 01/02/2018 1224      Latest Ref Rng & Units 02/22/2024    8:07 AM 01/02/2018   12:24 PM 08/06/2017   11:44 AM  BMP  Glucose 65 - 99  mg/dL 76  708  737   BUN 7 - 25 mg/dL 18  15  86.8   Creatinine 0.70 - 1.35 mg/dL 8.83  8.79  1.1   BUN/Creat Ratio 6 - 22 (calc) SEE NOTE:     Sodium 135 - 146 mmol/L 140  136  138   Potassium 3.5 - 5.3 mmol/L 3.8  3.5  3.6   Chloride 98 - 110 mmol/L 103  97    CO2 20 - 32 mmol/L 28  26  30    Calcium  8.6 - 10.3 mg/dL 89.7   9.5  89.8        Component Value Date/Time   WBC 7.3 01/02/2018 1224   WBC 7.9 08/06/2017 1144   WBC 7.0 11/02/2008 0905   RBC 4.86 01/02/2018 1224   RBC 4.86 01/02/2018 1224   HGB 14.9 01/02/2018 1224   HGB 15.0 08/06/2017 1144   HCT 41.9 01/02/2018 1224   HCT 42.8 08/06/2017 1144   PLT 188 01/02/2018 1224   PLT 180 08/06/2017 1144   MCV 86.2 01/02/2018 1224   MCV 86.1 08/06/2017 1144   MCH 30.7 01/02/2018 1224   MCHC 35.6 01/02/2018 1224   RDW 11.9 01/02/2018 1224   RDW 12.2 08/06/2017 1144   LYMPHSABS 1.7 01/02/2018 1224   LYMPHSABS 1.4 08/06/2017 1144   MONOABS 0.5 01/02/2018 1224   MONOABS 0.9 08/06/2017 1144   EOSABS 0.3 01/02/2018 1224   EOSABS 0.2 08/06/2017 1144   BASOSABS 0.0 01/02/2018 1224   BASOSABS 0.0 08/06/2017 1144     Parts of this note may have been dictated using voice recognition software. There may be variances in spelling and vocabulary which are unintentional. Not all errors are proofread. Please notify the dino if any discrepancies are noted or if the meaning of any statement is not clear.

## 2024-06-03 ENCOUNTER — Other Ambulatory Visit: Payer: Self-pay | Admitting: "Endocrinology

## 2024-06-03 DIAGNOSIS — E1140 Type 2 diabetes mellitus with diabetic neuropathy, unspecified: Secondary | ICD-10-CM

## 2024-06-03 NOTE — Telephone Encounter (Signed)
 Requested Prescriptions   Pending Prescriptions Disp Refills   MOUNJARO  10 MG/0.5ML Pen [Pharmacy Med Name: MOUNJARO  10MG /0.5ML INJ (4 PENS)] 6 mL 0    Sig: ADMINISTER 10 MG UNDER THE SKIN 1 TIME A WEEK

## 2024-06-04 ENCOUNTER — Encounter: Payer: Self-pay | Admitting: "Endocrinology

## 2024-06-04 DIAGNOSIS — E1140 Type 2 diabetes mellitus with diabetic neuropathy, unspecified: Secondary | ICD-10-CM

## 2024-06-04 MED ORDER — LANCET DEVICE MISC: 1.0000 | Freq: Every day | 0 refills | Status: AC

## 2024-06-05 MED ORDER — MICROLET LANCETS MISC: 1.0000 | Freq: Every day | 0 refills | Status: DC

## 2024-06-05 NOTE — Addendum Note (Signed)
 Addended by: ARLOA JEOFFREY SAILOR on: 06/05/2024 10:37 AM   Modules accepted: Orders

## 2024-06-25 DIAGNOSIS — M7581 Other shoulder lesions, right shoulder: Secondary | ICD-10-CM | POA: Diagnosis not present

## 2024-07-04 ENCOUNTER — Encounter: Payer: Self-pay | Admitting: "Endocrinology

## 2024-07-08 DIAGNOSIS — M25511 Pain in right shoulder: Secondary | ICD-10-CM | POA: Diagnosis not present

## 2024-07-15 DIAGNOSIS — M25511 Pain in right shoulder: Secondary | ICD-10-CM | POA: Diagnosis not present

## 2024-07-18 DIAGNOSIS — M25511 Pain in right shoulder: Secondary | ICD-10-CM | POA: Diagnosis not present

## 2024-07-21 DIAGNOSIS — M25511 Pain in right shoulder: Secondary | ICD-10-CM | POA: Diagnosis not present

## 2024-07-23 DIAGNOSIS — M25511 Pain in right shoulder: Secondary | ICD-10-CM | POA: Diagnosis not present

## 2024-07-30 DIAGNOSIS — M25511 Pain in right shoulder: Secondary | ICD-10-CM | POA: Diagnosis not present

## 2024-08-01 DIAGNOSIS — M25511 Pain in right shoulder: Secondary | ICD-10-CM | POA: Diagnosis not present

## 2024-08-04 DIAGNOSIS — M25511 Pain in right shoulder: Secondary | ICD-10-CM | POA: Diagnosis not present

## 2024-08-08 DIAGNOSIS — M25511 Pain in right shoulder: Secondary | ICD-10-CM | POA: Diagnosis not present

## 2024-08-29 ENCOUNTER — Other Ambulatory Visit: Payer: Self-pay | Admitting: "Endocrinology

## 2024-08-29 DIAGNOSIS — E1140 Type 2 diabetes mellitus with diabetic neuropathy, unspecified: Secondary | ICD-10-CM

## 2024-08-31 ENCOUNTER — Other Ambulatory Visit: Payer: Self-pay | Admitting: "Endocrinology

## 2024-08-31 DIAGNOSIS — E1140 Type 2 diabetes mellitus with diabetic neuropathy, unspecified: Secondary | ICD-10-CM

## 2024-09-02 ENCOUNTER — Ambulatory Visit: Admitting: "Endocrinology

## 2024-09-23 DIAGNOSIS — R809 Proteinuria, unspecified: Secondary | ICD-10-CM | POA: Diagnosis not present

## 2024-09-23 DIAGNOSIS — E559 Vitamin D deficiency, unspecified: Secondary | ICD-10-CM | POA: Diagnosis not present

## 2024-09-25 DIAGNOSIS — D472 Monoclonal gammopathy: Secondary | ICD-10-CM | POA: Diagnosis not present

## 2024-09-25 DIAGNOSIS — I10 Essential (primary) hypertension: Secondary | ICD-10-CM | POA: Diagnosis not present

## 2024-09-25 DIAGNOSIS — R809 Proteinuria, unspecified: Secondary | ICD-10-CM | POA: Diagnosis not present

## 2024-09-25 DIAGNOSIS — E559 Vitamin D deficiency, unspecified: Secondary | ICD-10-CM | POA: Diagnosis not present

## 2024-10-01 ENCOUNTER — Encounter: Payer: Self-pay | Admitting: "Endocrinology

## 2024-10-01 ENCOUNTER — Ambulatory Visit (INDEPENDENT_AMBULATORY_CARE_PROVIDER_SITE_OTHER): Admitting: "Endocrinology

## 2024-10-01 ENCOUNTER — Other Ambulatory Visit

## 2024-10-01 VITALS — BP 134/84 | HR 84 | Ht 69.0 in | Wt 182.0 lb

## 2024-10-01 DIAGNOSIS — E782 Mixed hyperlipidemia: Secondary | ICD-10-CM | POA: Diagnosis not present

## 2024-10-01 DIAGNOSIS — Z7984 Long term (current) use of oral hypoglycemic drugs: Secondary | ICD-10-CM | POA: Diagnosis not present

## 2024-10-01 DIAGNOSIS — Z7985 Long-term (current) use of injectable non-insulin antidiabetic drugs: Secondary | ICD-10-CM

## 2024-10-01 DIAGNOSIS — E1140 Type 2 diabetes mellitus with diabetic neuropathy, unspecified: Secondary | ICD-10-CM

## 2024-10-01 LAB — POCT GLYCOSYLATED HEMOGLOBIN (HGB A1C): Hemoglobin A1C: 7.4 % — AB (ref 4.0–5.6)

## 2024-10-01 MED ORDER — TIRZEPATIDE 7.5 MG/0.5ML ~~LOC~~ SOAJ
7.5000 mg | SUBCUTANEOUS | 3 refills | Status: AC
Start: 2024-10-01 — End: ?

## 2024-10-01 MED ORDER — GLIMEPIRIDE 2 MG PO TABS: 2.0000 mg | ORAL_TABLET | ORAL | 1 refills | Status: AC | PRN

## 2024-10-01 NOTE — Progress Notes (Signed)
 Outpatient Endocrinology Note Obadiah Birmingham, MD  10/01/24   Norleen PARAS Pangle 07/17/1961 [id number]  Referring Provider: Morgan Drivers, MD Primary Care Provider: Morgan Drivers, MD Reason for consultation: Subjective   Assessment & Plan  Diagnoses and all orders for this visit:  Type 2 diabetes mellitus with diabetic neuropathy, without long-term current use of insulin (HCC) -     POCT glycosylated hemoglobin (Hb A1C) -     Microalbumin / creatinine urine ratio  Long term (current) use of oral hypoglycemic drugs  Long-term (current) use of injectable non-insulin antidiabetic drugs  Mixed hypercholesterolemia and hypertriglyceridemia  Other orders -     glimepiride (AMARYL) 2 MG tablet; Take 1 tablet (2 mg total) by mouth as needed (for blood sugar more than 200). -     tirzepatide  (MOUNJARO ) 7.5 MG/0.5ML Pen; Inject 7.5 mg into the skin once a week.   Diabetes Type II complicated by neuropathy, No results found for: GFR Hba1c goal less than 7, current Hba1c is  Lab Results  Component Value Date   HGBA1C 6.3 (A) 06/02/2024   Will recommend the following: Doesn't want insulin Metformin XR 500 mg 4 pills a day Jardiance 25 mg every day   Mounjaro  7.5 mg/week (decreased from 10mg  due to GI S/E)  Glimepiride 2 mg every day PRN (PRN BG >200)  DexCom/libre was denied   Stopped Januvia 100 mg every day   No known contraindications/side effects to any of above medications No history of MEN syndrome/medullary thyroid  cancer/pancreatitis or pancreatic cancer in self or family  -Last LD and Tg are as follows: 08/2023 LDL 86, Tg 302  Lab Results  Component Value Date   LDLCALC 79 05/29/2024    Lab Results  Component Value Date   TRIG 128 05/29/2024   -On rosuvastatin  10 mg every day, fenofibrate 160 mg every day, fish oil 3 pills a day  -Follow low fat diet and exercise   -Blood pressure goal <140/90 - Microalbumin/creatinine goal is < 30 -Last MA/Cr is as  follows: Lab Results  Component Value Date   MICROALBUR 9.2 10/23/2023   -on ACE/ARB Benazepril 20 mg bid -diet changes including salt restriction -limit eating outside -counseled BP targets per standards of diabetes care -uncontrolled blood pressure can lead to retinopathy, nephropathy and cardiovascular and atherosclerotic heart disease  Reviewed and counseled on: -A1C target -Blood sugar targets -Complications of uncontrolled diabetes  -Checking blood sugar before meals and bedtime and bring log next visit -All medications with mechanism of action and side effects -Hypoglycemia management: rule of 15's, Glucagon Emergency Kit and medical alert ID -low-carb low-fat plate-method diet -At least 20 minutes of physical activity per day -Annual dilated retinal eye exam and foot exam -compliance and follow up needs -follow up as scheduled or earlier if problem gets worse  Call if blood sugar is less than 70 or consistently above 250    Take a 15 gm snack of carbohydrate at bedtime before you go to sleep if your blood sugar is less than 100.    If you are going to fast after midnight for a test or procedure, ask your physician for instructions on how to reduce/decrease your insulin dose.    Call if blood sugar is less than 70 or consistently above 250  -Treating a low sugar by rule of 15  (15 gms of sugar every 15 min until sugar is more than 70) If you feel your sugar is low, test your sugar to be sure  If your sugar is low (less than 70), then take 15 grams of a fast acting Carbohydrate (3-4 glucose tablets or glucose gel or 4 ounces of juice or regular soda) Recheck your sugar 15 min after treating low to make sure it is more than 70 If sugar is still less than 70, treat again with 15 grams of carbohydrate          Don't drive the hour of hypoglycemia  If unconscious/unable to eat or drink by mouth, use glucagon injection or nasal spray baqsimi and call 911. Can repeat again in 15  min if still unconscious.  Return in about 3 months (around 01/01/2025) for labs today.  I have reviewed current medications, nurse's notes, allergies, vital signs, past medical and surgical history, family medical history, and social history for this encounter. Counseled patient on symptoms, examination findings, lab findings, imaging results, treatment decisions and monitoring and prognosis. The patient understood the recommendations and agrees with the treatment plan. All questions regarding treatment plan were fully answered.  Obadiah Birmingham, MD  10/01/24   History of Present Illness JACORION KLEM is a 63 y.o. year old male who presents for follow up of Type II diabetes mellitus.  [place] was first diagnosed in 1999.   Diabetes education +  Home diabetes regimen: Metformin XR 500 mg 4 pills a day  Mounjaro  10 mg/week  Jardiance 25 mg every day   Glimepiride 4 mg every day PRN (only when BG >250)  COMPLICATIONS -  MI/Stroke -  retinopathy +  neuropathy -  nephropathy  BLOOD SUGAR DATA Checks afternoon before supper  108-181 range  Physical Exam  BP 134/84   Pulse 84   Ht 5' 9 (1.753 m)   Wt 182 lb (82.6 kg)   SpO2 96%   BMI 26.88 kg/m    Constitutional: well developed, well nourished Head: normocephalic, atraumatic Eyes: sclera anicteric, no redness Neck: supple Lungs: normal respiratory effort Neurology: alert and oriented Skin: dry, no appreciable rashes Musculoskeletal: no appreciable defects Psychiatric: normal mood and affect Diabetic Foot Exam - Simple   No data filed      Current Medications Patient's Medications  New Prescriptions   GLIMEPIRIDE (AMARYL) 2 MG TABLET    Take 1 tablet (2 mg total) by mouth as needed (for blood sugar more than 200).   TIRZEPATIDE  (MOUNJARO ) 7.5 MG/0.5ML PEN    Inject 7.5 mg into the skin once a week.  Previous Medications   AMLODIPINE (NORVASC) 10 MG TABLET    TK 1 T PO QD   ASCORBIC ACID (VITAMIN C)  1000 MG TABLET    Take 1,000 mg by mouth daily.   ASPIRIN EC 81 MG TABLET    Take 81 mg by mouth daily.   BAYER CONTOUR NEXT TEST TEST STRIP    CHECK BLOOD SUGAR ONCE DAILY   BENAZEPRIL-HYDROCHLORTHIAZIDE (LOTENSIN HCT) 20-12.5 MG TABLET    TK 2 TS PO QD   CHOLECALCIFEROL (VITAMIN D PO)    Take 2,000 Units by mouth.    FENOFIBRATE 160 MG TABLET    1 tablet Orally Once a day for 90 days   JARDIANCE 25 MG TABS TABLET    Take 25 mg by mouth daily.   LANCET DEVICE MISC    1 each by Does not apply route daily.   METFORMIN (GLUCOPHAGE-XR) 500 MG 24 HR TABLET    TK 4 TS PO QAM   METOPROLOL SUCCINATE (TOPROL-XL) 100 MG 24 HR TABLET  TK 1 T PO BID   MICROLET LANCETS MISC    USE TO ASSIST IN CHECKING BLOOD GLUCOSE LEVELS   POTASSIUM CHLORIDE (MICRO-K) 10 MEQ CR CAPSULE    TK 1 C PO QD WF   ROSUVASTATIN  (CRESTOR ) 10 MG TABLET    Take 1 tablet (10 mg total) by mouth daily.  Modified Medications   No medications on file  Discontinued Medications   GLIMEPIRIDE (AMARYL) 4 MG TABLET    TK 1 T PO BID WC   MOUNJARO  10 MG/0.5ML PEN    ADMINISTER 10 MG UNDER THE SKIN 1 TIME A WEEK    Allergies No Known Allergies  Past Medical History Past Medical History:  Diagnosis Date   Diabetes (HCC)    HTN (hypertension)    Hypokalemia     Past Surgical History Past Surgical History:  Procedure Laterality Date   COLONOSCOPY  2018    Family History family history includes Diabetes in his sister; Healthy in his sister; Heart attack in his mother; Heart disease in his father and mother; Thyroid  disease in his sister.  Social History Social History   Socioeconomic History   Marital status: Married    Spouse name: Not on file   Number of children: Not on file   Years of education: Not on file   Highest education level: Not on file  Occupational History   Not on file  Tobacco Use   Smoking status: Never   Smokeless tobacco: Never  Vaping Use   Vaping status: Never Used  Substance and Sexual  Activity   Alcohol use: Yes    Comment: Social - weekly   Drug use: No   Sexual activity: Yes  Other Topics Concern   Not on file  Social History Narrative   Not on file   Social Drivers of Health   Financial Resource Strain: Not on file  Food Insecurity: Not on file  Transportation Needs: Not on file  Physical Activity: Not on file  Stress: Not on file  Social Connections: Not on file  Intimate Partner Violence: Not on file    Lab Results  Component Value Date   HGBA1C 6.3 (A) 06/02/2024   HGBA1C 6.5 (A) 02/29/2024   HGBA1C 9.4 (H) 10/23/2023   Lab Results  Component Value Date   CHOL 136 05/29/2024   Lab Results  Component Value Date   HDL 35 (L) 05/29/2024   Lab Results  Component Value Date   LDLCALC 79 05/29/2024   Lab Results  Component Value Date   TRIG 128 05/29/2024   Lab Results  Component Value Date   CHOLHDL 3.9 05/29/2024   Lab Results  Component Value Date   CREATININE 1.16 02/22/2024   No results found for: GFR Lab Results  Component Value Date   MICROALBUR 9.2 10/23/2023      Component Value Date/Time   NA 140 02/22/2024 0807   NA 138 08/06/2017 1144   K 3.8 02/22/2024 0807   K 3.6 08/06/2017 1144   CL 103 02/22/2024 0807   CO2 28 02/22/2024 0807   CO2 30 (H) 08/06/2017 1144   GLUCOSE 76 02/22/2024 0807   GLUCOSE 262 (H) 08/06/2017 1144   BUN 18 02/22/2024 0807   BUN 13.1 08/06/2017 1144   CREATININE 1.16 02/22/2024 0807   CREATININE 1.1 08/06/2017 1144   CALCIUM  10.2 02/22/2024 0807   CALCIUM  10.1 08/06/2017 1144   PROT 7.0 02/22/2024 0807   PROT 8.0 08/06/2017 1144   PROT 7.4 08/06/2017  1144   ALBUMIN 4.1 01/02/2018 1224   ALBUMIN 4.3 08/06/2017 1144   AST 23 02/22/2024 0807   AST 48 (H) 08/06/2017 1144   ALT 31 02/22/2024 0807   ALT 99 (H) 08/06/2017 1144   ALKPHOS 72 01/02/2018 1224   ALKPHOS 63 08/06/2017 1144   BILITOT 0.7 02/22/2024 0807   BILITOT 1.16 08/06/2017 1144   GFRNONAA >60 01/02/2018 1224   GFRAA  >60 01/02/2018 1224      Latest Ref Rng & Units 02/22/2024    8:07 AM 01/02/2018   12:24 PM 08/06/2017   11:44 AM  BMP  Glucose 65 - 99 mg/dL 76  708  737   BUN 7 - 25 mg/dL 18  15  86.8   Creatinine 0.70 - 1.35 mg/dL 8.83  8.79  1.1   BUN/Creat Ratio 6 - 22 (calc) SEE NOTE:     Sodium 135 - 146 mmol/L 140  136  138   Potassium 3.5 - 5.3 mmol/L 3.8  3.5  3.6   Chloride 98 - 110 mmol/L 103  97    CO2 20 - 32 mmol/L 28  26  30    Calcium  8.6 - 10.3 mg/dL 89.7  9.5  89.8        Component Value Date/Time   WBC 7.3 01/02/2018 1224   WBC 7.9 08/06/2017 1144   WBC 7.0 11/02/2008 0905   RBC 4.86 01/02/2018 1224   RBC 4.86 01/02/2018 1224   HGB 14.9 01/02/2018 1224   HGB 15.0 08/06/2017 1144   HCT 41.9 01/02/2018 1224   HCT 42.8 08/06/2017 1144   PLT 188 01/02/2018 1224   PLT 180 08/06/2017 1144   MCV 86.2 01/02/2018 1224   MCV 86.1 08/06/2017 1144   MCH 30.7 01/02/2018 1224   MCHC 35.6 01/02/2018 1224   RDW 11.9 01/02/2018 1224   RDW 12.2 08/06/2017 1144   LYMPHSABS 1.7 01/02/2018 1224   LYMPHSABS 1.4 08/06/2017 1144   MONOABS 0.5 01/02/2018 1224   MONOABS 0.9 08/06/2017 1144   EOSABS 0.3 01/02/2018 1224   EOSABS 0.2 08/06/2017 1144   BASOSABS 0.0 01/02/2018 1224   BASOSABS 0.0 08/06/2017 1144     Parts of this note may have been dictated using voice recognition software. There may be variances in spelling and vocabulary which are unintentional. Not all errors are proofread. Please notify the dino if any discrepancies are noted or if the meaning of any statement is not clear.

## 2024-10-02 DIAGNOSIS — K760 Fatty (change of) liver, not elsewhere classified: Secondary | ICD-10-CM | POA: Diagnosis not present

## 2024-10-02 DIAGNOSIS — Z23 Encounter for immunization: Secondary | ICD-10-CM | POA: Diagnosis not present

## 2024-10-02 DIAGNOSIS — I10 Essential (primary) hypertension: Secondary | ICD-10-CM | POA: Diagnosis not present

## 2024-10-02 DIAGNOSIS — D472 Monoclonal gammopathy: Secondary | ICD-10-CM | POA: Diagnosis not present

## 2024-10-02 DIAGNOSIS — R809 Proteinuria, unspecified: Secondary | ICD-10-CM | POA: Diagnosis not present

## 2024-10-02 DIAGNOSIS — E782 Mixed hyperlipidemia: Secondary | ICD-10-CM | POA: Diagnosis not present

## 2024-10-02 LAB — MICROALBUMIN / CREATININE URINE RATIO
Creatinine, Urine: 55 mg/dL (ref 20–320)
Microalb Creat Ratio: 100 mg/g{creat} — ABNORMAL HIGH (ref <30)
Microalb, Ur: 5.5 mg/dL

## 2024-12-30 ENCOUNTER — Telehealth: Admitting: "Endocrinology
